# Patient Record
Sex: Female | Born: 1990 | Race: White | Hispanic: No | Marital: Married | Smoking: Current some day smoker
Health system: Southern US, Community
[De-identification: ages and names within clinical notes are randomized; demographics above are authoritative.]

## PROBLEM LIST (undated history)

## (undated) ENCOUNTER — Inpatient Hospital Stay (HOSPITAL_COMMUNITY): Payer: Self-pay

## (undated) DIAGNOSIS — F419 Anxiety disorder, unspecified: Secondary | ICD-10-CM

## (undated) DIAGNOSIS — D696 Thrombocytopenia, unspecified: Secondary | ICD-10-CM

## (undated) DIAGNOSIS — F32A Depression, unspecified: Secondary | ICD-10-CM

## (undated) DIAGNOSIS — Z862 Personal history of diseases of the blood and blood-forming organs and certain disorders involving the immune mechanism: Secondary | ICD-10-CM

## (undated) DIAGNOSIS — D693 Immune thrombocytopenic purpura: Secondary | ICD-10-CM

## (undated) DIAGNOSIS — F329 Major depressive disorder, single episode, unspecified: Secondary | ICD-10-CM

## (undated) HISTORY — PX: WISDOM TOOTH EXTRACTION: SHX21

---
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
## 2013-12-04 ENCOUNTER — Emergency Department (HOSPITAL_COMMUNITY)
Admission: EM | Admit: 2013-12-04 | Discharge: 2013-12-04 | Disposition: A | Discharge status: Home / Self Care | Payer: BC Managed Care – PPO | Attending: Emergency Medicine | Admitting: Emergency Medicine

## 2013-12-04 ENCOUNTER — Encounter (HOSPITAL_COMMUNITY): Payer: Self-pay | Admitting: Emergency Medicine

## 2013-12-04 ENCOUNTER — Emergency Department (HOSPITAL_COMMUNITY): Payer: BC Managed Care – PPO

## 2013-12-04 DIAGNOSIS — S161XXA Strain of muscle, fascia and tendon at neck level, initial encounter: Secondary | ICD-10-CM

## 2013-12-04 DIAGNOSIS — S4980XA Other specified injuries of shoulder and upper arm, unspecified arm, initial encounter: Secondary | ICD-10-CM | POA: Insufficient documentation

## 2013-12-04 DIAGNOSIS — Z3202 Encounter for pregnancy test, result negative: Secondary | ICD-10-CM | POA: Insufficient documentation

## 2013-12-04 DIAGNOSIS — S8002XA Contusion of left knee, initial encounter: Secondary | ICD-10-CM

## 2013-12-04 DIAGNOSIS — Y9389 Activity, other specified: Secondary | ICD-10-CM | POA: Insufficient documentation

## 2013-12-04 DIAGNOSIS — S8001XA Contusion of right knee, initial encounter: Secondary | ICD-10-CM

## 2013-12-04 DIAGNOSIS — Y92410 Unspecified street and highway as the place of occurrence of the external cause: Secondary | ICD-10-CM | POA: Insufficient documentation

## 2013-12-04 DIAGNOSIS — S46909A Unspecified injury of unspecified muscle, fascia and tendon at shoulder and upper arm level, unspecified arm, initial encounter: Secondary | ICD-10-CM | POA: Insufficient documentation

## 2013-12-04 DIAGNOSIS — S20219A Contusion of unspecified front wall of thorax, initial encounter: Secondary | ICD-10-CM | POA: Insufficient documentation

## 2013-12-04 DIAGNOSIS — S8000XA Contusion of unspecified knee, initial encounter: Secondary | ICD-10-CM | POA: Insufficient documentation

## 2013-12-04 DIAGNOSIS — S139XXA Sprain of joints and ligaments of unspecified parts of neck, initial encounter: Secondary | ICD-10-CM | POA: Insufficient documentation

## 2013-12-04 HISTORY — DX: Thrombocytopenia, unspecified: D69.6

## 2013-12-04 LAB — BASIC METABOLIC PANEL
BUN: 11 mg/dL (ref 6–23)
CALCIUM: 8.7 mg/dL (ref 8.4–10.5)
CHLORIDE: 106 meq/L (ref 96–112)
CO2: 25 mEq/L (ref 19–32)
CREATININE: 0.53 mg/dL (ref 0.50–1.10)
GFR calc non Af Amer: >90 mL/min (ref >90)
Glucose, Bld: 80 mg/dL (ref 70–99)
Potassium: 3.7 mEq/L (ref 3.7–5.3)
SODIUM: 143 meq/L (ref 137–147)

## 2013-12-04 LAB — CBC WITH DIFFERENTIAL/PLATELET
BASOS ABS: 0 10*3/uL (ref 0.0–0.1)
BASOS PCT: 0 % (ref 0–1)
Eosinophils Absolute: 0.3 10*3/uL (ref 0.0–0.7)
Eosinophils Relative: 3 % (ref 0–5)
HEMATOCRIT: 35.6 % — AB (ref 36.0–46.0)
Hemoglobin: 11.6 g/dL — ABNORMAL LOW (ref 12.0–15.0)
LYMPHS PCT: 42 % (ref 12–46)
Lymphs Abs: 3.9 10*3/uL (ref 0.7–4.0)
MCH: 29.4 pg (ref 26.0–34.0)
MCHC: 32.6 g/dL (ref 30.0–36.0)
MCV: 90.1 fL (ref 78.0–100.0)
MONO ABS: 0.6 10*3/uL (ref 0.1–1.0)
Monocytes Relative: 7 % (ref 3–12)
NEUTROS ABS: 4.3 10*3/uL (ref 1.7–7.7)
Neutrophils Relative %: 48 % (ref 43–77)
Platelets: 174 10*3/uL (ref 150–400)
RBC: 3.95 MIL/uL (ref 3.87–5.11)
RDW: 13 % (ref 11.5–15.5)
WBC: 9.1 10*3/uL (ref 4.0–10.5)

## 2013-12-04 LAB — POC URINE PREG, ED: PREG TEST UR: NEGATIVE

## 2013-12-04 MED ORDER — SODIUM CHLORIDE 0.9 % IV SOLN
1000.0000 mL | INTRAVENOUS | Status: DC
  Administered 2013-12-04: 1000 mL via INTRAVENOUS

## 2013-12-04 MED ORDER — MORPHINE SULFATE 4 MG/ML IJ SOLN
4.0000 mg | Freq: Once | INTRAMUSCULAR | Status: AC
  Administered 2013-12-04: 4 mg via INTRAVENOUS
  Filled 2013-12-04: qty 1

## 2013-12-04 MED ORDER — OXYCODONE-ACETAMINOPHEN 5-325 MG PO TABS: 1.0000 | ORAL_TABLET | ORAL | Status: AC | PRN

## 2013-12-04 MED ORDER — IOHEXOL 300 MG/ML  SOLN
100.0000 mL | Freq: Once | INTRAMUSCULAR | Status: AC | PRN
  Administered 2013-12-04: 100 mL via INTRAVENOUS

## 2013-12-04 MED ORDER — SODIUM CHLORIDE 0.9 % IV SOLN
1000.0000 mL | Freq: Once | INTRAVENOUS | Status: AC
  Administered 2013-12-04: 1000 mL via INTRAVENOUS
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
## 2013-12-04 NOTE — ED Notes (Signed)
CT phoned, notified pt available for transport.  Advised pregnancy test had resulted.

## 2014-09-24 ENCOUNTER — Emergency Department (HOSPITAL_COMMUNITY)
Admission: EM | Admit: 2014-09-24 | Discharge: 2014-09-24 | Disposition: A | Discharge status: Home / Self Care | Payer: Self-pay | Attending: Emergency Medicine | Admitting: Emergency Medicine

## 2014-09-24 ENCOUNTER — Encounter (HOSPITAL_COMMUNITY): Payer: Self-pay | Admitting: Emergency Medicine

## 2014-09-24 DIAGNOSIS — Z880 Allergy status to penicillin: Secondary | ICD-10-CM | POA: Insufficient documentation

## 2014-09-24 DIAGNOSIS — K029 Dental caries, unspecified: Secondary | ICD-10-CM | POA: Insufficient documentation

## 2014-09-24 DIAGNOSIS — K0889 Other specified disorders of teeth and supporting structures: Secondary | ICD-10-CM

## 2014-09-24 DIAGNOSIS — Z862 Personal history of diseases of the blood and blood-forming organs and certain disorders involving the immune mechanism: Secondary | ICD-10-CM | POA: Insufficient documentation

## 2014-09-24 DIAGNOSIS — K088 Other specified disorders of teeth and supporting structures: Secondary | ICD-10-CM | POA: Insufficient documentation

## 2014-09-24 MED ORDER — HYDROCODONE-ACETAMINOPHEN 5-325 MG PO TABS: 1.0000 | ORAL_TABLET | Freq: Four times a day (QID) | ORAL | Status: AC | PRN

## 2014-09-24 MED ORDER — CLINDAMYCIN HCL 150 MG PO CAPS
300.0000 mg | ORAL_CAPSULE | Freq: Once | ORAL | Status: AC
  Administered 2014-09-24: 300 mg via ORAL
  Filled 2014-09-24: qty 2

## 2014-09-24 MED ORDER — OXYCODONE-ACETAMINOPHEN 5-325 MG PO TABS
2.0000 | ORAL_TABLET | Freq: Once | ORAL | Status: AC
Start: 2014-09-24 — End: 2014-09-24
  Administered 2014-09-24: 2 via ORAL
  Filled 2014-09-24: qty 2

## 2014-09-24 MED ORDER — CLINDAMYCIN HCL 150 MG PO CAPS: 300.0000 mg | ORAL_CAPSULE | Freq: Three times a day (TID) | ORAL | Status: DC

## 2014-09-24 NOTE — Discharge Instructions (Signed)

## 2014-09-24 NOTE — ED Notes (Signed)
Pt here for left upper dental pain and swelling since yesterday, airway intact.  Pt unable to pick up prescription for antibiotics.

## 2014-09-24 NOTE — ED Provider Notes (Signed)
CSN: [id number]     Arrival date & time 09/24/14  2108 History   First MD Initiated Contact with Patient 09/24/14 2303     Chief Complaint  Patient presents with  . Dental Pain    (Consider location/radiation/quality/duration/timing/severity/associated sxs/prior Treatment) Patient is a 24 y.o. female presenting with tooth pain. The history is provided by the patient. No language interpreter was used.  Dental Pain Location:  Upper Upper teeth location:  13/LU 2nd bicuspid Quality:  Burning and throbbing Severity:  Moderate Onset quality:  Gradual Duration:  2 days Timing:  Constant Progression:  Worsening Chronicity:  Recurrent (Similar sx 1 year ago) Context: dental caries   Context: not trauma   Previous work-up:  Dental exam Relieved by:  Nothing Worsened by:  Touching and pressure Ineffective treatments:  NSAIDs Associated symptoms: facial swelling and gum swelling   Associated symptoms: no difficulty swallowing, no drooling, no fever, no oral bleeding, no oral lesions and no trismus     Past Medical History  Diagnosis Date  . Decreased platelet count    History reviewed. No pertinent past surgical history. No family history on file. History  Substance Use Topics  . Smoking status: Never Smoker   . Smokeless tobacco: Never Used  . Alcohol Use: No   OB History    No data available      Review of Systems  Constitutional: Negative for fever.  HENT: Positive for dental problem and facial swelling. Negative for drooling and mouth sores.   All other systems reviewed and are negative.   Allergies  Amoxicillin  Home Medications   Prior to Admission medications   Medication Sig Start Date End Date Taking? Authorizing Provider  clindamycin (CLEOCIN) 150 MG capsule Take 2 capsules (300 mg total) by mouth 3 (three) times daily. May dispense as 150mg  capsules 09/24/14   Antony MaduraKelly Randi Poullard, PA-C  HYDROcodone-acetaminophen (NORCO/VICODIN) 5-325 MG per tablet Take 1-2 tablets  by mouth every 6 (six) hours as needed. 09/24/14   Antony MaduraKelly Feliz Herard, PA-C  oxyCODONE-acetaminophen (PERCOCET) 5-325 MG per tablet Take 1 tablet by mouth every 4 (four) hours as needed for moderate pain. Patient not taking: Reported on 09/24/2014 12/04/13   Dione Boozeavid Glick, MD   BP 110/80 mmHg  Pulse 75  Temp(Src) 97.5 F (36.4 C) (Oral)  Resp 17  Ht 5\' 3"  (1.6 m)  SpO2 100%  LMP 09/17/2014   Physical Exam  Constitutional: She is oriented to person, place, and time. She appears well-developed and well-nourished. No distress.  Nontoxic/nonseptic appearing  HENT:  Head: Normocephalic and atraumatic.  Mouth/Throat: Uvula is midline, oropharynx is clear and moist and mucous membranes are normal. No trismus in the jaw. Dental caries present. No dental abscesses or uvula swelling.    Tenderness to palpation to left upper second premolar. There is mild facial swelling with no trismus. Uvula midline. Patient tolerating secretions without difficulty.  Eyes: Conjunctivae and EOM are normal. No scleral icterus.  Neck: Normal range of motion.  No nuchal rigidity or meningismus  Pulmonary/Chest: Effort normal. No respiratory distress.  Respirations even and unlabored  Musculoskeletal: Normal range of motion.  Neurological: She is alert and oriented to person, place, and time. She exhibits normal muscle tone. Coordination normal.  Skin: Skin is warm and dry. No rash noted. She is not diaphoretic. No erythema. No pallor.  Psychiatric: She has a normal mood and affect. Her behavior is normal.  Nursing note and vitals reviewed.   ED Course  Procedures (including critical care time)  Labs Review Labs Reviewed - No data to display  Imaging Review No results found.   EKG Interpretation None      MDM   Final diagnoses:  Dentalgia    Patient with toothache. No gross abscess. Exam unconcerning for Ludwig's angina or spread of infection. Will treat with clindamycin and pain medicine.Urged patient to  follow-up with dentist. Appt scheduled for 10/05/14. Return precautions given. Patient agreeable to plan with no unaddressed concerns.   Filed Vitals:   09/24/14 2148 09/24/14 2319  BP: 112/68 110/80  Pulse: 70 75  Temp: 98.2 F (36.8 C) 97.5 F (36.4 C)  TempSrc: Oral Oral  Resp: 18 17  Height:  (1.6 m)   SpO2: 100% 100%       Antony Madura, PA-C 09/24/14 2327  Dione Booze, MD 09/25/14 [phone]

## 2014-11-08 ENCOUNTER — Encounter (HOSPITAL_COMMUNITY): Payer: Self-pay | Admitting: *Deleted

## 2014-11-08 ENCOUNTER — Emergency Department (HOSPITAL_COMMUNITY)
Admission: EM | Admit: 2014-11-08 | Discharge: 2014-11-08 | Disposition: A | Discharge status: Home / Self Care | Payer: Self-pay | Attending: Emergency Medicine | Admitting: Emergency Medicine

## 2014-11-08 ENCOUNTER — Emergency Department (HOSPITAL_COMMUNITY): Payer: Self-pay

## 2014-11-08 DIAGNOSIS — Y9301 Activity, walking, marching and hiking: Secondary | ICD-10-CM | POA: Insufficient documentation

## 2014-11-08 DIAGNOSIS — Z880 Allergy status to penicillin: Secondary | ICD-10-CM | POA: Insufficient documentation

## 2014-11-08 DIAGNOSIS — Y9289 Other specified places as the place of occurrence of the external cause: Secondary | ICD-10-CM | POA: Insufficient documentation

## 2014-11-08 DIAGNOSIS — T148XXA Other injury of unspecified body region, initial encounter: Secondary | ICD-10-CM

## 2014-11-08 DIAGNOSIS — S70311A Abrasion, right thigh, initial encounter: Secondary | ICD-10-CM | POA: Insufficient documentation

## 2014-11-08 DIAGNOSIS — W540XXA Bitten by dog, initial encounter: Secondary | ICD-10-CM | POA: Insufficient documentation

## 2014-11-08 DIAGNOSIS — Z862 Personal history of diseases of the blood and blood-forming organs and certain disorders involving the immune mechanism: Secondary | ICD-10-CM | POA: Insufficient documentation

## 2014-11-08 DIAGNOSIS — S81831A Puncture wound without foreign body, right lower leg, initial encounter: Secondary | ICD-10-CM | POA: Insufficient documentation

## 2014-11-08 DIAGNOSIS — Y998 Other external cause status: Secondary | ICD-10-CM | POA: Insufficient documentation

## 2014-11-08 DIAGNOSIS — Z23 Encounter for immunization: Secondary | ICD-10-CM | POA: Insufficient documentation

## 2014-11-08 DIAGNOSIS — Z87891 Personal history of nicotine dependence: Secondary | ICD-10-CM | POA: Insufficient documentation

## 2014-11-08 HISTORY — DX: Immune thrombocytopenic purpura: D69.3

## 2014-11-08 MED ORDER — CLINDAMYCIN HCL 150 MG PO CAPS: 450.0000 mg | ORAL_CAPSULE | Freq: Three times a day (TID) | ORAL | Status: AC

## 2014-11-08 MED ORDER — SULFAMETHOXAZOLE-TRIMETHOPRIM 800-160 MG PO TABS: 1.0000 | ORAL_TABLET | Freq: Two times a day (BID) | ORAL | Status: AC

## 2014-11-08 MED ORDER — TETANUS-DIPHTH-ACELL PERTUSSIS 5-2.5-18.5 LF-MCG/0.5 IM SUSP
0.5000 mL | Freq: Once | INTRAMUSCULAR | Status: AC
  Administered 2014-11-08: 0.5 mL via INTRAMUSCULAR
  Filled 2014-11-08: qty 0.5

## 2014-11-08 MED ORDER — HYDROCODONE-ACETAMINOPHEN 5-325 MG PO TABS: 1.0000 | ORAL_TABLET | Freq: Four times a day (QID) | ORAL | Status: AC | PRN

## 2014-11-08 MED ORDER — HYDROCODONE-ACETAMINOPHEN 5-325 MG PO TABS
2.0000 | ORAL_TABLET | Freq: Once | ORAL | Status: AC
Start: 2014-11-08 — End: 2014-11-08
  Administered 2014-11-08: 2 via ORAL
  Filled 2014-11-08: qty 2

## 2014-11-08 NOTE — ED Provider Notes (Signed)
CSN: [id number]     Arrival date & time 11/08/14  1416 History   First MD Initiated Contact with Patient 11/08/14 1436     Chief Complaint  Patient presents with  . Animal Bite   The history is provided by the patient. No language interpreter was used.   This chart was scribed for non-physician practitioner Ladona MowJoe Nijah Orlich, PA-C, working with No att. providers found, by Andrew Auaven Small, ED Scribe. This patient was seen in room TR10C/TR10C and the patient's care was started at 6:41 PM.  Gwendolyn Lawrence is a 24 y.o. female who presents to the Emergency Department complaining of an animal bite. Pt states while walking outside she was chased by her neighbor's 24 year old dog, who got loose from his chain, and was bit 3 times to left leg. Pt unsure if dog is UTD on immunizations and states her neighbor took the dog to the shelter immediately after she was bitten to have it quarantined. She took ibuprofen but is still in pain. Pt is unsure of last TDAP.     Past Medical History  Diagnosis Date  . Decreased platelet count   . ITP (idiopathic thrombocytopenic purpura)    Past Surgical History  Procedure Laterality Date  . Wisdom tooth extraction     History reviewed. No pertinent family history. History  Substance Use Topics  . Smoking status: Former Games developermoker  . Smokeless tobacco: Never Used  . Alcohol Use: No   OB History    No data available     Review of Systems  Skin: Positive for wound.  Neurological: Negative for weakness and numbness.      Allergies  Amoxicillin  Home Medications   Prior to Admission medications   Medication Sig Start Date End Date Taking? Authorizing Provider  clindamycin (CLEOCIN) 150 MG capsule Take 3 capsules (450 mg total) by mouth 3 (three) times daily. 11/08/14   Ladona MowJoe Nansi [place], PA-C  HYDROcodone-acetaminophen (NORCO/VICODIN) 5-325 MG per tablet Take 1-2 tablets by mouth every 6 (six) hours as needed. 09/24/14   Antony MaduraKelly Humes, PA-C  HYDROcodone-acetaminophen  (NORCO/VICODIN) 5-325 MG per tablet Take 1-2 tablets by mouth every 6 (six) hours as needed. 11/08/14   Ladona MowJoe Timmey Lamba, PA-C  oxyCODONE-acetaminophen (PERCOCET) 5-325 MG per tablet Take 1 tablet by mouth every 4 (four) hours as needed for moderate pain. Patient not taking: Reported on 09/24/2014 12/04/13   Dione Boozeavid Glick, MD  sulfamethoxazole-trimethoprim (BACTRIM DS,SEPTRA DS) 800-160 MG per tablet Take 1 tablet by mouth 2 (two) times daily. 11/08/14   Ladona MowJoe Abagale Boulos, PA-C   BP 100/70 mmHg  Pulse 50  Temp(Src) 98.6 F (37 C) (Oral)  Resp 16  Ht 5\' 2"  (1.575 m)  Wt 115 lb (52.164 kg)  BMI 21.03 kg/m2  SpO2 100%  LMP 11/07/2014 Physical Exam  Constitutional: She is oriented to person, place, and time. She appears well-developed and well-nourished. No distress.  HENT:  Head: Normocephalic and atraumatic.  Eyes: Conjunctivae and EOM are normal.  Neck: Neck supple.  Cardiovascular: Normal rate.   Pulmonary/Chest: Effort normal.  Musculoskeletal: Normal range of motion.  Neurological: She is alert and oriented to person, place, and time.  Skin: Skin is warm and dry.  2, 3-4 cm abrasions noted to upper thigh on the lateral aspect of right leg. Puncture wound noted to anterior portion of tibial region on right leg, approximately 1 cm in diameter.  Psychiatric: She has a normal mood and affect. Her behavior is normal.  Nursing note and vitals reviewed.  ED Course  Procedures (including critical care time) DIAGNOSTIC STUDIES: Oxygen Saturation is 100% on RA, normal by my interpretation.    COORDINATION OF CARE: 6:41 PM- Pt advised of plan for treatment and pt agrees.  Labs Review Labs Reviewed - No data to display  Imaging Review Dg Tibia/fibula Right  11/08/2014   CLINICAL DATA:  Dog bite to the right leg  EXAM: RIGHT TIBIA AND FIBULA - 2 VIEW  COMPARISON:  None.  FINDINGS: There is no evidence of fracture or other focal bone lesions. Soft tissues are unremarkable.  IMPRESSION: Negative.    Electronically Signed   By: Christiana Pellant M.D.   On: 11/08/2014 16:53   Dg Ankle Complete Right  11/08/2014   CLINICAL DATA:  Dog bite to the right leg  EXAM: RIGHT ANKLE - COMPLETE 3+ VIEW  COMPARISON:  None.  FINDINGS: There is no evidence of fracture, dislocation, or joint effusion. There is no evidence of arthropathy or other focal bone abnormality. Soft tissues are unremarkable.  IMPRESSION: Negative.   Electronically Signed   By: Christiana Pellant M.D.   On: 11/08/2014 16:53   Dg Femur, Min 2 Views Right  11/08/2014   CLINICAL DATA:  Dog bite to the right leg  EXAM: RIGHT FEMUR 2 VIEWS  COMPARISON:  None.  FINDINGS: There is no evidence of fracture or other focal bone lesions. Soft tissues are unremarkable. No radiopaque foreign body.  IMPRESSION: Negative.   Electronically Signed   By: Christiana Pellant M.D.   On: 11/08/2014 16:52     EKG Interpretation None      MDM   Final diagnoses:  Animal bite    Patient here dog bite. Tetanus updated. Radiographs unremarkable for foreign bodies. Patient has 2 small abrasions, and one puncture wound. Neither the wounds are amenable to any repair given the size and consistency with puncture wounds and abrasions. Radiographs unremarkable for acute pathology or foreign bodies. Wounds were irrigated extensively with saline. Given that the dog is a known animal/pet to her neighbors, and that the dog is currently being quarantined, do not need to initiate rabies vaccination/4 reflexes at this time. Educated patient on proper return should she need prophylaxis/vaccination. Patient verbalizes understanding and agreement with this. Place patient on clindamycin and Bactrim due to amoxicillin allergy, this was per pharmacy consult. Discussed return precautions with patient regarding her wounds, patient verbalizes understanding and agreement of this plan. Encouraged patient to call or return to the ER if any worsening symptoms or should she have a questions or  concerns.  I personally performed the services described in this documentation, which was scribed in my presence. The recorded information has been reviewed and is accurate.  BP 100/70 mmHg  Pulse 50  Temp(Src) 98.6 F (37 C) (Oral)  Resp 16  Ht  (1.575 m)  Wt 115 lb (52.164 kg)  BMI 21.03 kg/m2  SpO2 100%  LMP 11/07/2014  Signed,  Ladona Mow, PA-C 6:41 PM    Ladona Mow, PA-C 11/08/14 1841  Arby Barrette, MD 11/14/14 [phone]

## 2014-11-08 NOTE — ED Notes (Signed)
Declined W/C at D/C and was escorted to lobby by RN. 

## 2014-11-08 NOTE — Discharge Instructions (Signed)
Return to the emergency room or [facility] if he find out the dog you're bitten by any symptoms of rabies. Also return if you find the dog has not been immunized against rabies. Return to ER if your wounds become infected with redness, warmth, swelling, purulent discharge with green, yellow, white, thick fluid. Return with any high fever greater 100.93F.  Animal Bite An animal bite can result in a scratch on the skin, deep open cut, puncture of the skin, crush injury, or tearing away of the skin or a body part. Dogs are responsible for most animal bites. Children are bitten more often than adults. An animal bite can range from very mild to more serious. A small bite from your house pet is no cause for alarm. However, some animal bites can become infected or injure a bone or other tissue. You must seek medical care if:  The skin is broken and bleeding does not slow down or stop after 15 minutes.  The puncture is deep and difficult to clean (such as a cat bite).  Pain, warmth, redness, or pus develops around the wound.  The bite is from a stray animal or rodent. There may be a risk of rabies infection.  The bite is from a snake, raccoon, skunk, fox, coyote, or bat. There may be a risk of rabies infection.  The person bitten has a chronic illness such as diabetes, liver disease, or cancer, or the person takes medicine that lowers the immune system.  There is concern about the location and severity of the bite. It is important to clean and protect an animal bite wound right away to prevent infection. Follow these steps:  Clean the wound with plenty of water and soap.  Apply an antibiotic cream.  Apply gentle pressure over the wound with a clean towel or gauze to slow or stop bleeding.  Elevate the affected area above the heart to help stop any bleeding.  Seek medical care. Getting medical care within 8 hours of the animal bite leads to the best possible outcome. DIAGNOSIS  Your  caregiver will most likely:  Take a detailed history of the animal and the bite injury.  Perform a wound exam.  Take your medical history. Blood tests or X-rays may be performed. Sometimes, infected bite wounds are cultured and sent to a lab to identify the infectious bacteria.  TREATMENT  Medical treatment will depend on the location and type of animal bite as well as the patient's medical history. Treatment may include:  Wound care, such as cleaning and flushing the wound with saline solution, bandaging, and elevating the affected area.  Antibiotics.  Tetanus immunization.  Rabies immunization.  Leaving the wound open to heal. This is often done with animal bites, due to the high risk of infection. However, in certain cases, wound closure with stitches, wound adhesive, skin adhesive strips, or staples may be used. Infected bites that are left untreated may require intravenous (IV) antibiotics and surgical treatment in the hospital. HOME CARE INSTRUCTIONS  Follow your caregiver's instructions for wound care.  Take all medicines as directed.  If your caregiver prescribes antibiotics, take them as directed. Finish them even if you start to feel better.  Follow up with your caregiver for further exams or immunizations as directed. You may need a tetanus shot if:  You cannot remember when you had your last tetanus shot.  You have never had a tetanus shot.  The injury broke your skin. If you get a  tetanus shot, your arm may swell, get red, and feel warm to the touch. This is common and not a problem. If you need a tetanus shot and you choose not to have one, there is a rare chance of getting tetanus. Sickness from tetanus can be serious. SEEK MEDICAL CARE IF:  You notice warmth, redness, soreness, swelling, pus discharge, or a bad smell coming from the wound.  You have a red line on the skin coming from the wound.  You have a fever, chills, or a general ill feeling.  You  have nausea or vomiting.  You have continued or worsening pain.  You have trouble moving the injured part.  You have other questions or concerns. MAKE SURE YOU:  Understand these instructions.  Will watch your condition.  Will get help right away if you are not doing well or get worse. Document Released: 03/21/2011 Document Revised: 09/25/2011 Document Reviewed: 03/21/2011 [facility] Patient Information 2015 [place], [place]. This information is not intended to replace advice given to you by your health care provider. Make sure you discuss any questions you have with your health care provider.   Emergency Department Resource Guide 1) Find a Doctor and Pay Out of Pocket Although you won't have to find out who is covered by your insurance plan, it is a good idea to ask around and get recommendations. You will then need to call the office and see if the doctor you have chosen will accept you as a new patient and what types of options they offer for patients who are self-pay. Some doctors offer discounts or will set up payment plans for their patients who do not have insurance, but you will need to ask so you aren't surprised when you get to your appointment.  2) Contact Your Local Health Department Not all health departments have doctors that can see patients for sick visits, but many do, so it is worth a call to see if yours does. If you don't know where your local health department is, you can check in your phone book. The CDC also has a tool to help you locate your state's health department, and many state websites also have listings of all of their local health departments.  3) Find a Walk-in Clinic If your illness is not likely to be very severe or complicated, you may want to try a walk in clinic. These are popping up all over the country in pharmacies, drugstores, and shopping centers. They're usually staffed by nurse practitioners or physician assistants that have been trained to treat  common illnesses and complaints. They're usually fairly quick and inexpensive. However, if you have serious medical issues or chronic medical problems, these are probably not your best option.  No Primary Care Doctor: - Call Health Connect at  [phone] - they can help you locate a primary care doctor that  accepts your insurance, provides certain services, etc. - Physician Referral Service- [phone]  Chronic Pain Problems: Organization         Address  Phone   Notes  [facility]  [phone] Patients need to be referred by their primary care doctor.   Medication Assistance: Organization         Address  Phone   Notes  [facility] Medication [facility] [address] [place] [place], [place] [address] [phone] --Must be a resident of [facility] -- Must have NO insurance coverage whatsoever (no Medicaid/ Medicare, etc.) -- The pt. MUST have a  primary care doctor that directs their care regularly and follows them in the community   [facility]ist  [phone]   [employer]  [phone]    Agencies that provide inexpensive medical care: Organization         Address  Phone   Notes  [facility]  [phone]   [facility]    [phone]   [facility] [address] [place], [place] [address] [phone]   [facility] [address] [place], [place] [phone]   [facility]anned Parenthood    [phone]   [facility]    [phone]   [facility]  [address], [place] Phone:  [phone], Fax:  [phone] Hours of Operation:  9 am - 6 pm, M-F.  Also accepts Medicaid/Medicare and self-pay.  [facility]  [address], [place] Phone: [phone], Fax: [phone]. Hours of Operation:  8:30 am - 5:30 pm, M-F.  Also accepts Medicaid and self-pay.  [facility] [place] [address] [place] Phone: [phone]   [facility] [address] Natasha Bence [place], [place] [phone], Ext. [phone] Mondays & Thursdays: 7-9 AM.  First 15 patients are seen on a first come, first serve basis.    Medicaid-accepting [facility] Providers:  Organization         Address  Phone   Notes  [facility] [address], [place] [phone] Also accepts self-pay patients.  [facility] [address] Laurell Josephs [place], [place]  [phone]   [facility] [address], [place] [phone]   [facility] [address] [place] [phone]   Renaye Rakers [address], [place]   [phone] Only accepts [place] Access [place] patients after they have their name applied to their card.   Self-Pay (no insurance) in [facility]:  Organization         Address  Phone   Notes  Sickle Cell Patients, [facility] [address] [place], [place] [phone]   [facility] [address] [place], [place] [phone]   [facility] [place]  [address], [place] [phone]   [facility]/Dr. Osei-Bonsu  [address], [place] or [address], [place] [phone] Phone number for both [place] and [place] locations is the same.  [facility] [address], [place] [phone]   [facility] [address] [place] or [address] [phone]   [facility] [address], [place] [phone], phone; [phone], fax Sees patients 1st and 3rd Saturday of every month.  Must not qualify for public or private insurance (i.e. Medicaid, Medicare,  Health Choice, Veterans' Benefits)  Household income should be no more than 200% of the  poverty level The clinic cannot treat you if you are pregnant or think you are pregnant  Sexually transmitted diseases are not treated at the clinic.    Dental Care: Organization         Address  Phone  Notes  [facility] [address] [place], [place] [phone])  [phone] Accepts children up to age 24 who are enrolled in Medicaid or [place] Health Choice; pregnant women with a Medicaid card; and children who have applied for Medicaid or [place] Health Choice, but were declined, whose parents can pay a reduced fee at time of service.  [facility] [place]  [address], [place] [phone] Accepts children up to age 24 who are enrolled in [place] or [place] Health Choice; pregnant women with a Medicaid card; and children who have applied for Medicaid or [place] Health Choice, but were declined, whose parents can pay a reduced fee at time of service.  [facility]  [address], [place] [phone] Patients are seen by appointment only. Walk-ins are not accepted. [facility] will see patients 24 years of age and older. Monday - Tuesday (8am-5pm) Most Wednesdays (8:30-5pm) $30 per visit, cash only  [facility]  [address], [facility] [place] [phone] Patients are seen by appointment only. Walk-ins are not accepted. [facility] will see patients 24 years of age and older. One Wednesday Evening (Monthly: Volunteer Based).  $30 per visit, cash only  [employer] [facility]  [phone] for adults; Children under age 434, call Graduate Pediatric Dentistry at [phone]. Children aged 904-14, please call [phone] to request a pediatric application.  Dental services are provided in all areas of dental care including fillings, crowns and bridges, complete and partial dentures, implants, gum treatment, root canals, and extractions.  Preventive care is also provided. Treatment is provided to both adults and children. Patients are selected via a lottery and there is often a waiting list.   [facility] [address], [place]  [phone] [email or web address]   [facility] [address], [place], [place] [phone], Ext. [phone] Second and Fourth Thursday of each month, opens at 6:30 AM; Clinic ends at 9 AM.  Patients are seen on a first-come first-served basis, and a limited number are seen during each clinic.   [facility]  [address], [place], [place] [phone]   Eligibility Requirements You must have lived in [place], [place], or [place] counties for at least the last three months.   You cannot be eligible for state or federal sponsored [place] Cityhealthcare insurance, including CIGNAVeterans Administration, [place]icaid, or [employer]icare.   You generally cannot be eligible for healthcare insurance through your employer.    How to apply: Eligibility screenings are held every Tuesday and Wednesday afternoon from 1:00 pm until 4:00 pm. You do not need an appointment for the interview!  [facility] [address], [place], [place] [phone]   [facility]  [phone]   [facility]  [phone]   [facility]  [phone]    Behavioral Health Resources in the Community: Intensive Outpatient Programs Organization         Address  Phone  Notes  [facility] [address], [place], [place] [phone]   [facility] [address], [place], [place] [phone]   [facility] [address], [place], [place]  [phone]   [facility] [address],  [place], [place] [phone] or [phone]   Substance Abuse Resources Organization         Address  Phone  Notes  [facility]  [phone]  [place]  [phone]   [facility] [place]   Chinita Pester  [phone]   Residential & Outpatient Substance Abuse Program  [phone]   Psychological Services Organization         Address  Phone  Notes  [facility] [place]  [place]  [phone]   [place] [address] [place] or [phone]    Mobile Crisis Teams Organization         Address  Phone  Notes  [facility], Mobile Crisis Care Unit  [phone]   [facility]  [address] [place], [place]   [place] Levels [address] [place] [phone]    Self-Help/Support Groups Organization         Address  Phone             Notes  [place]. of [place] - variety of support groups  Clare Call for more information  Narcotics Anonymous (NA), [facility] [address], [employer] [place]  2 meetings at this location   Special educational needs teacher         Address  Phone  Notes  [facility]idential [facility] [place],    [place]  [phone]   [facility]  [address], [place] [address], [place], [place]   [place], [place] [phone] Admissions: 8am-3pm M-F  Incentives Substance [place] [address]    [place], [place] [phone]   [facility] [address] [place], [place], [place]   [facility] [address],  [place], [place]   [facility] - Intensive Outpatient [address] Kristeen Mans [address], [place], [place]   [facility] ([address] [place].,  [place], [place] [phone] or [phone]   [facility] (RTS) [address] [place], [place] Accepts Medicaid  [facility] [address]  [place]  [phone] Substance Abuse/Addiction Treatment   [facility] Organization         Address  Phone  Notes  [facility]  [phone]   Domenic Schwab, PhD [address] Arlis Porta [place], [place]   [phone] or [phone]   [place], [place] [phone]   [facility] [address] [place] [address] [place], [place] [phone] Insurance/Medicaid/sponsorship through [facility] [address] [place]                                    [place], [place] [phone] [place] [address], [place] [phone]    Dr. Adele Schilder  [phone]   [facility] 1) [address]  2) [place] 3)  [address], [place] [phone]  [phone]   [place] [phone] or [phone] (After Hours)

## 2017-07-27 ENCOUNTER — Emergency Department (HOSPITAL_COMMUNITY): Admission: EM | Admit: 2017-07-27 | Discharge: 2017-07-27 | Discharge status: Absent without leave | Payer: Self-pay

## 2017-07-27 ENCOUNTER — Inpatient Hospital Stay (HOSPITAL_COMMUNITY)
Admission: AD | Admit: 2017-07-27 | Discharge: 2017-07-27 | Disposition: A | Discharge status: Home / Self Care | Payer: Self-pay | Source: Ambulatory Visit | Attending: Obstetrics and Gynecology | Admitting: Obstetrics and Gynecology

## 2017-07-27 ENCOUNTER — Encounter (HOSPITAL_COMMUNITY): Payer: Self-pay | Admitting: *Deleted

## 2017-07-27 DIAGNOSIS — O9989 Other specified diseases and conditions complicating pregnancy, childbirth and the puerperium: Secondary | ICD-10-CM

## 2017-07-27 DIAGNOSIS — K529 Noninfective gastroenteritis and colitis, unspecified: Secondary | ICD-10-CM

## 2017-07-27 DIAGNOSIS — Z87891 Personal history of nicotine dependence: Secondary | ICD-10-CM | POA: Insufficient documentation

## 2017-07-27 DIAGNOSIS — Z3A13 13 weeks gestation of pregnancy: Secondary | ICD-10-CM | POA: Insufficient documentation

## 2017-07-27 DIAGNOSIS — Z880 Allergy status to penicillin: Secondary | ICD-10-CM | POA: Insufficient documentation

## 2017-07-27 DIAGNOSIS — O26891 Other specified pregnancy related conditions, first trimester: Secondary | ICD-10-CM | POA: Insufficient documentation

## 2017-07-27 DIAGNOSIS — R197 Diarrhea, unspecified: Secondary | ICD-10-CM | POA: Insufficient documentation

## 2017-07-27 DIAGNOSIS — O219 Vomiting of pregnancy, unspecified: Secondary | ICD-10-CM | POA: Insufficient documentation

## 2017-07-27 HISTORY — DX: Personal history of diseases of the blood and blood-forming organs and certain disorders involving the immune mechanism: Z86.2

## 2017-07-27 LAB — CBC WITH DIFFERENTIAL/PLATELET
BASOS ABS: 0 10*3/uL (ref 0.0–0.1)
BASOS PCT: 0 %
Eosinophils Absolute: 0 10*3/uL (ref 0.0–0.7)
Eosinophils Relative: 0 %
HCT: 38.1 % (ref 36.0–46.0)
Hemoglobin: 13.5 g/dL (ref 12.0–15.0)
LYMPHS PCT: 4 %
Lymphs Abs: 0.8 10*3/uL (ref 0.7–4.0)
MCH: 30.7 pg (ref 26.0–34.0)
MCHC: 35.4 g/dL (ref 30.0–36.0)
MCV: 86.6 fL (ref 78.0–100.0)
MONO ABS: 0.5 10*3/uL (ref 0.1–1.0)
Monocytes Relative: 2 %
Neutro Abs: 19.6 10*3/uL — ABNORMAL HIGH (ref 1.7–7.7)
Neutrophils Relative %: 94 %
PLATELETS: 183 10*3/uL (ref 150–400)
RBC: 4.4 MIL/uL (ref 3.87–5.11)
RDW: 12.8 % (ref 11.5–15.5)
WBC: 20.9 10*3/uL — AB (ref 4.0–10.5)

## 2017-07-27 LAB — URINALYSIS, ROUTINE W REFLEX MICROSCOPIC
Bilirubin Urine: NEGATIVE
GLUCOSE, UA: NEGATIVE mg/dL
HGB URINE DIPSTICK: NEGATIVE
Ketones, ur: 80 mg/dL — AB
Leukocytes, UA: NEGATIVE
NITRITE: NEGATIVE
PROTEIN: 100 mg/dL — AB
Specific Gravity, Urine: 1.031 — ABNORMAL HIGH (ref 1.005–1.030)
pH: 5 (ref 5.0–8.0)

## 2017-07-27 LAB — COMPREHENSIVE METABOLIC PANEL
ALT: 20 U/L (ref 14–54)
AST: 22 U/L (ref 15–41)
Albumin: 4.5 g/dL (ref 3.5–5.0)
Alkaline Phosphatase: 54 U/L (ref 38–126)
Anion gap: 13 (ref 5–15)
BUN: 18 mg/dL (ref 6–20)
CO2: 18 mmol/L — AB (ref 22–32)
Calcium: 9 mg/dL (ref 8.9–10.3)
Chloride: 104 mmol/L (ref 101–111)
Creatinine, Ser: 0.48 mg/dL (ref 0.44–1.00)
Glucose, Bld: 160 mg/dL — ABNORMAL HIGH (ref 65–99)
POTASSIUM: 3.7 mmol/L (ref 3.5–5.1)
Sodium: 135 mmol/L (ref 135–145)
Total Bilirubin: 0.8 mg/dL (ref 0.3–1.2)
Total Protein: 7.5 g/dL (ref 6.5–8.1)

## 2017-07-27 MED ORDER — PROMETHAZINE HCL 25 MG RE SUPP: 25.0000 mg | Freq: Four times a day (QID) | RECTAL | 0 refills | Status: DC | PRN

## 2017-07-27 MED ORDER — PROMETHAZINE HCL 25 MG/ML IJ SOLN
25.0000 mg | Freq: Once | INTRAMUSCULAR | Status: AC
  Administered 2017-07-27: 25 mg via INTRAVENOUS
  Filled 2017-07-27: qty 1

## 2017-07-27 MED ORDER — PROMETHAZINE HCL 25 MG PO TABS: 25.0000 mg | ORAL_TABLET | Freq: Four times a day (QID) | ORAL | 0 refills | Status: DC | PRN

## 2017-07-27 MED ORDER — DEXTROSE IN LACTATED RINGERS 5 % IV SOLN
INTRAVENOUS | Status: DC
  Administered 2017-07-27: 09:00:00 via INTRAVENOUS

## 2017-07-27 NOTE — Discharge Instructions (Signed)
Viral Gastroenteritis, Adult  Viral gastroenteritis is also known as the stomach flu. This condition is caused by certain germs (viruses). These germs can be passed from person to person very easily (are very contagious). This condition can cause sudden watery poop (diarrhea), fever, and throwing up (vomiting).  Having watery poop and throwing up can make you feel weak and cause you to get dehydrated. Dehydration can make you tired and thirsty, make you have a dry mouth, and make it so you pee (urinate) less often. Older adults and people with other diseases or a weak defense system (immune system) are at higher risk for dehydration. It is important to replace the fluids that you lose from having watery poop and throwing up.  Follow these instructions at home:  Follow instructions from your doctor about how to care for yourself at home.  Eating and drinking    Follow these instructions as told by your doctor:   Take an oral rehydration solution (ORS). This is a drink that is sold at pharmacies and stores.   Drink clear fluids in small amounts as you are able, such as:  ? Water.  ? Ice chips.  ? Diluted fruit juice.  ? Low-calorie sports drinks.   Eat bland, easy-to-digest foods in small amounts as you are able, such as:  ? Bananas.  ? Applesauce.  ? Rice.  ? Low-fat (lean) meats.  ? Toast.  ? Crackers.   Avoid fluids that have a lot of sugar or caffeine in them.   Avoid alcohol.   Avoid spicy or fatty foods.    General instructions   Drink enough fluid to keep your pee (urine) clear or pale yellow.   Wash your hands often. If you cannot use soap and water, use hand sanitizer.   Make sure that all people in your home wash their hands well and often.   Rest at home while you get better.   Take over-the-counter and prescription medicines only as told by your doctor.   Watch your condition for any changes.   Take a warm bath to help with any burning or pain from having watery poop.   Keep all follow-up  visits as told by your doctor. This is important.  Contact a doctor if:   You cannot keep fluids down.   Your symptoms get worse.   You have new symptoms.   You feel light-headed or dizzy.   You have muscle cramps.  Get help right away if:   You have chest pain.   You feel very weak or you pass out (faint).   You see blood in your throw-up.   Your throw-up looks like coffee grounds.   You have bloody or black poop (stools) or poop that look like tar.   You have a very bad headache, a stiff neck, or both.   You have a rash.   You have very bad pain, cramping, or bloating in your belly (abdomen).   You have trouble breathing.   You are breathing very quickly.   Your heart is beating very quickly.   Your skin feels cold and clammy.   You feel confused.   You have pain when you pee.   You have signs of dehydration, such as:  ? Dark pee, hardly any pee, or no pee.  ? Cracked lips.  ? Dry mouth.  ? Sunken eyes.  ? Sleepiness.  ? Weakness.  This information is not intended to replace advice given to you by your   health care provider. Make sure you discuss any questions you have with your health care provider.  Document Released: 12/20/2007 Document Revised: 01/21/2016 Document Reviewed: 03/09/2015  Elsevier Interactive Patient Education  2017 [employer]        Food Choices to Help Relieve Diarrhea, Adult  When you have diarrhea, the foods you eat and your eating habits are very important. Choosing the right foods and drinks can help:   Relieve diarrhea.   Replace lost fluids and nutrients.   Prevent dehydration.    What general guidelines should I follow?  Relieving diarrhea   Choose foods with less than 2 g or .07 oz. of fiber per serving.   Limit fats to less than 8 tsp (38 g or 1.34 oz.) a day.   Avoid the following:  ? Foods and beverages sweetened with high-fructose corn syrup, honey, or sugar alcohols such as xylitol, sorbitol, and mannitol.  ? Foods that contain a lot of fat or  sugar.  ? Fried, greasy, or spicy foods.  ? High-fiber grains, breads, and cereals.  ? Raw fruits and vegetables.   Eat foods that are rich in probiotics. These foods include dairy products such as yogurt and fermented milk products. They help increase healthy bacteria in the stomach and intestines (gastrointestinal tract, or GI tract).   If you have lactose intolerance, avoid dairy products. These may make your diarrhea worse.   Take medicine to help stop diarrhea (antidiarrheal medicine) only as told by your health care provider.  Replacing nutrients   Eat small meals or snacks every 3-4 hours.   Eat bland foods, such as white rice, toast, or baked potato, until your diarrhea starts to get better. Gradually reintroduce nutrient-rich foods as tolerated or as told by your health care provider. This includes:  ? Well-cooked protein foods.  ? Peeled, seeded, and soft-cooked fruits and vegetables.  ? Low-fat dairy products.   Take vitamin and mineral supplements as told by your health care provider.  Preventing dehydration     Start by sipping water or a special solution to prevent dehydration (oral rehydration solution, ORS). Urine that is clear or pale yellow means that you are getting enough fluid.   Try to drink at least 8-10 cups of fluid each day to help replace lost fluids.   You may add other liquids in addition to water, such as clear juice or decaffeinated sports drinks, as tolerated or as told by your health care provider.   Avoid drinks with caffeine, such as coffee, tea, or soft drinks.   Avoid alcohol.  What foods are recommended?  The items listed may not be a complete list. Talk with your health care provider about what dietary choices are best for you.  Grains  White rice. White, French, or pita breads (fresh or toasted), including plain rolls, buns, or bagels. White pasta. Saltine, soda, or graham crackers. Pretzels. Low-fiber cereal. Cooked cereals made with water (such as cornmeal,  farina, or cream cereals). Plain muffins. Matzo. Melba toast. Zwieback.  Vegetables  Potatoes (without the skin). Most well-cooked and canned vegetables without skins or seeds. Tender lettuce.  Fruits  Apple sauce. Fruits canned in juice. Cooked apricots, cherries, grapefruit, peaches, pears, or plums. Fresh bananas and cantaloupe.  Meats and other protein foods  Baked or boiled chicken. Eggs. Tofu. Fish. Seafood. Smooth nut butters. Ground or well-cooked tender beef, ham, veal, lamb, pork, or poultry.  Dairy  Plain yogurt, kefir, and unsweetened liquid yogurt. Lactose-free milk, buttermilk, skim   milk, or soy milk. Low-fat or nonfat hard cheese.  Beverages  Water. Low-calorie sports drinks. Fruit juices without pulp. Strained tomato and vegetable juices. Decaffeinated teas. Sugar-free beverages not sweetened with sugar alcohols. Oral rehydration solutions, if approved by your health care provider.  Seasoning and other foods  Bouillon, broth, or soups made from recommended foods.  What foods are not recommended?  The items listed may not be a complete list. Talk with your health care provider about what dietary choices are best for you.  Grains  Whole grain, whole wheat, bran, or rye breads, rolls, pastas, and crackers. Wild or brown rice. Whole grain or bran cereals. Barley. Oats and oatmeal. Corn tortillas or taco shells. Granola. Popcorn.  Vegetables  Raw vegetables. Fried vegetables. Cabbage, broccoli, Brussels sprouts, artichokes, baked beans, beet greens, corn, kale, legumes, peas, sweet potatoes, and yams. Potato skins. Cooked spinach and cabbage.  Fruits  Dried fruit, including raisins and dates. Raw fruits. Stewed or dried prunes. Canned fruits with syrup.  Meat and other protein foods  Fried or fatty meats. Deli meats. Chunky nut butters. Nuts and seeds. Beans and lentils. Bacon. Hot dogs. Sausage.  Dairy  High-fat cheeses. Whole milk, chocolate milk, and beverages made with milk, such as milk shakes.  Half-and-half. Cream. sour cream. Ice cream.  Beverages  Caffeinated beverages (such as coffee, tea, soda, or energy drinks). Alcoholic beverages. Fruit juices with pulp. Prune juice. Soft drinks sweetened with high-fructose corn syrup or sugar alcohols. High-calorie sports drinks.  Fats and oils  Butter. Cream sauces. Margarine. Salad oils. Plain salad dressings. Olives. Avocados. Mayonnaise.  Sweets and desserts  Sweet rolls, doughnuts, and sweet breads. Sugar-free desserts sweetened with sugar alcohols such as xylitol and sorbitol.  Seasoning and other foods  Honey. Hot sauce. Chili powder. Gravy. Cream-based or milk-based soups. Pancakes and waffles.  Summary   When you have diarrhea, the foods you eat and your eating habits are very important.   Make sure you get at least 8-10 cups of fluid each day, or enough to keep your urine clear or pale yellow.   Eat bland foods and gradually reintroduce healthy, nutrient-rich foods as tolerated, or as told by your health care provider.   Avoid high-fiber, fried, greasy, or spicy foods.  This information is not intended to replace advice given to you by your health care provider. Make sure you discuss any questions you have with your health care provider.  Document Released: 09/23/2003 Document Revised: 06/30/2016 Document Reviewed: 06/30/2016  Elsevier Interactive Patient Education  2018 [employer]

## 2017-07-27 NOTE — MAU Provider Note (Signed)
Chief Complaint: Emesis; Diarrhea; and Abdominal Pain   First Provider Initiated Contact with Patient 07/27/17 0544        SUBJECTIVE HPI: Gwendolyn Lawrence is a 27 y.o. G2P1001 at 3758w1d by LMP who presents to maternity admissions reporting vomiting for 2 days preceded by 1 week of diarrhea.  States has 3-4 stools per day.  Has had some chills . She denies vaginal bleeding, vaginal itching/burning, urinary symptoms, h/a, dizziness, or fever    Emesis   This is a new problem. The current episode started in the past 7 days. The problem has been unchanged. There has been no fever. Associated symptoms include abdominal pain, chills and diarrhea. Pertinent negatives include no dizziness, fever, headaches or myalgias. She has tried nothing for the symptoms. The treatment provided no relief.  Diarrhea   This is a new problem. The current episode started in the past 7 days. The problem occurs 2 to 4 times per day. The problem has been unchanged. Associated symptoms include abdominal pain, chills and vomiting. Pertinent negatives include no fever, headaches or myalgias. Nothing aggravates the symptoms. She has tried nothing for the symptoms.  Abdominal Pain  This is a new problem. The current episode started in the past 7 days. The onset quality is gradual. The problem occurs intermittently. The problem has been unchanged. The pain is located in the suprapubic region. The quality of the pain is cramping. The abdominal pain does not radiate. Associated symptoms include diarrhea and vomiting. Pertinent negatives include no fever, headaches or myalgias.    RN Note: Pt presents to MAU c/o vomiting x 2 days and diarrhea x1week. Pt also reports abdominal cramping in her mid lower abdomen that she is rating 5/10. Pt states she can not keep any fluids down longer than 5min. Pt states she hasnt really urinated today and it is dark when and if she does go.     Past Medical History:  Diagnosis Date  . History  of ITP    Past Surgical History:  Procedure Laterality Date  . WISDOM TOOTH EXTRACTION     Social History   Socioeconomic History  . Marital status: Married    Spouse name: Not on file  . Number of children: Not on file  . Years of education: Not on file  . Highest education level: Not on file  Social Needs  . Financial resource strain: Not on file  . Food insecurity - worry: Not on file  . Food insecurity - inability: Not on file  . Transportation needs - medical: Not on file  . Transportation needs - non-medical: Not on file  Occupational History  . Not on file  Tobacco Use  . Smoking status: Former Games developermoker  . Smokeless tobacco: Former Engineer, waterUser  Substance and Sexual Activity  . Alcohol use: No    Frequency: Never  . Drug use: No  . Sexual activity: Yes  Other Topics Concern  . Not on file  Social History Narrative  . Not on file   No current facility-administered medications on file prior to encounter.    Current Outpatient Medications on File Prior to Encounter  Medication Sig Dispense Refill  . Prenatal Vit-Fe Fumarate-FA (PRENATAL MULTIVITAMIN) TABS tablet Take 1 tablet by mouth daily at 12 noon.     Allergies  Allergen Reactions  . Amoxicillin Hives    I have reviewed patient's Past Medical Hx, Surgical Hx, Family Hx, Social Hx, medications and allergies.   ROS:  Review of Systems  Constitutional: Positive for chills. Negative for fever.  Gastrointestinal: Positive for abdominal pain, diarrhea and vomiting.  Musculoskeletal: Negative for myalgias.  Neurological: Negative for dizziness and headaches.   Review of Systems  Other systems negative   Physical Exam  Physical Exam Patient Vitals for the past 24 hrs:  BP Temp Temp src Pulse Resp SpO2 Height Weight  07/27/17 0511 123/67 97.7 F (36.5 C) Oral (!) 113 19 98 % 5\' 3"  (1.6 m) 107 lb (48.5 kg)   Constitutional: Well-developed female in no acute distress.  Cardiovascular: mildly tachycardic,  RRR Respiratory: normal effort GI: Abd soft, non-tender. Pos BS x 4 MS: Extremities nontender, no edema, normal ROM Neurologic: Alert and oriented x 4.  GU: Neg CVAT.  PELVIC EXAM: deferred  FHT 125 by doppler  LAB RESULTS . Results for orders placed or performed during the hospital encounter of 07/27/17 (from the past 24 hour(s))  Urinalysis, Routine w reflex microscopic     Status: Abnormal   Collection Time: 07/27/17  5:20 AM  Result Value Ref Range   Color, Urine YELLOW YELLOW   APPearance HAZY (A) CLEAR   Specific Gravity, Urine 1.031 (H) 1.005 - 1.030   pH 5.0 5.0 - 8.0   Glucose, UA NEGATIVE NEGATIVE mg/dL   Hgb urine dipstick NEGATIVE NEGATIVE   Bilirubin Urine NEGATIVE NEGATIVE   Ketones, ur 80 (A) NEGATIVE mg/dL   Protein, ur 161 (A) NEGATIVE mg/dL   Nitrite NEGATIVE NEGATIVE   Leukocytes, UA NEGATIVE NEGATIVE   RBC / HPF 0-5 0 - 5 RBC/hpf   WBC, UA 0-5 0 - 5 WBC/hpf   Bacteria, UA RARE (A) NONE SEEN   Squamous Epithelial / LPF 6-30 (A) NONE SEEN   Mucus PRESENT   CBC with Differential/Platelet     Status: Abnormal   Collection Time: 07/27/17  5:55 AM  Result Value Ref Range   WBC 20.9 (H) 4.0 - 10.5 K/uL   RBC 4.40 3.87 - 5.11 MIL/uL   Hemoglobin 13.5 12.0 - 15.0 g/dL   HCT 09.6 04.5 - 40.9 %   MCV 86.6 78.0 - 100.0 fL   MCH 30.7 26.0 - 34.0 pg   MCHC 35.4 30.0 - 36.0 g/dL   RDW 81.1 91.4 - 78.2 %   Platelets 183 150 - 400 K/uL   Neutrophils Relative % 94 %   Neutro Abs 19.6 (H) 1.7 - 7.7 K/uL   Lymphocytes Relative 4 %   Lymphs Abs 0.8 0.7 - 4.0 K/uL   Monocytes Relative 2 %   Monocytes Absolute 0.5 0.1 - 1.0 K/uL   Eosinophils Relative 0 %   Eosinophils Absolute 0.0 0.0 - 0.7 K/uL   Basophils Relative 0 %   Basophils Absolute 0.0 0.0 - 0.1 K/uL  Comprehensive metabolic panel     Status: Abnormal   Collection Time: 07/27/17  5:55 AM  Result Value Ref Range   Sodium 135 135 - 145 mmol/L   Potassium 3.7 3.5 - 5.1 mmol/L   Chloride 104 101 - 111  mmol/L   CO2 18 (L) 22 - 32 mmol/L   Glucose, Bld 160 (H) 65 - 99 mg/dL   BUN 18 6 - 20 mg/dL   Creatinine, Ser 9.56 0.44 - 1.00 mg/dL   Calcium 9.0 8.9 - 21.3 mg/dL   Total Protein 7.5 6.5 - 8.1 g/dL   Albumin 4.5 3.5 - 5.0 g/dL   AST 22 15 - 41 U/L   ALT 20 14 - 54 U/L   Alkaline  Phosphatase 54 38 - 126 U/L   Total Bilirubin 0.8 0.3 - 1.2 mg/dL   GFR calc non Af Amer >60 >60 mL/min   GFR calc Af Amer >60 >60 mL/min   Anion gap 13 5 - 15      IMAGING No results found.  MAU Management/MDM: Ordered CBC and Cmet to check for systemic disease.  Ordered urinalysis WBC is elevated, chemistries normal Discussed with Dr Alysia Penna who thinks given the negative abdominal exam, that this reflects gastroenteritis.  Will continue to hydrate then PO challenge.  ASSESSMENT Single IUP @ [redacted]w[redacted]d  Probable gastroenteritis  PLAN IV hydration with antiemetic Report given to oncoming provider  Wynelle Bourgeois CNM, MSN Certified Nurse-Midwife 07/27/2017  5:44 AM  Patient reports relief with IV fluids, no episodes of vomiting or diarrhea while in MAU.   -Discharge home in stable condition -Rx for phenergan PO and suppositories sent to patient's pharmacy -Food choices and course of gastroenteritis precautions discussed -Patient advised to follow-up with OB/GYN of choice for prenatal care -Patient may return to MAU as needed or if her condition were to change or worsen  Gwendolyn Lawrence, CNM 07/27/17 10:05 AM

## 2017-07-27 NOTE — MAU Note (Signed)
Pt presents to MAU c/o vomiting x 2 days and diarrhea x1week. Pt also reports abdominal cramping in her mid lower abdomen that she is rating 5/10. Pt states she can not keep any fluids down longer than . Pt states she hasnt really urinated today and it is dark when and if she does go.

## 2017-08-10 ENCOUNTER — Encounter: Payer: Self-pay | Admitting: Obstetrics and Gynecology

## 2018-04-25 ENCOUNTER — Encounter (HOSPITAL_COMMUNITY): Payer: Self-pay

## 2019-01-14 ENCOUNTER — Ambulatory Visit (HOSPITAL_COMMUNITY): Payer: Self-pay | Admitting: Psychiatry

## 2019-01-19 ENCOUNTER — Emergency Department (HOSPITAL_COMMUNITY)
Admission: EM | Admit: 2019-01-19 | Discharge: 2019-01-20 | Disposition: A | Discharge status: Other Acute Inpatient Hospital | Payer: Self-pay | Attending: Emergency Medicine | Admitting: Emergency Medicine

## 2019-01-19 ENCOUNTER — Encounter (HOSPITAL_COMMUNITY): Payer: Self-pay | Admitting: Emergency Medicine

## 2019-01-19 ENCOUNTER — Emergency Department (HOSPITAL_COMMUNITY): Payer: Self-pay

## 2019-01-19 ENCOUNTER — Other Ambulatory Visit: Payer: Self-pay

## 2019-01-19 DIAGNOSIS — Z91411 Personal history of adult psychological abuse: Secondary | ICD-10-CM | POA: Insufficient documentation

## 2019-01-19 DIAGNOSIS — Z79899 Other long term (current) drug therapy: Secondary | ICD-10-CM | POA: Insufficient documentation

## 2019-01-19 DIAGNOSIS — Z03818 Encounter for observation for suspected exposure to other biological agents ruled out: Secondary | ICD-10-CM | POA: Insufficient documentation

## 2019-01-19 DIAGNOSIS — F3289 Other specified depressive episodes: Secondary | ICD-10-CM

## 2019-01-19 DIAGNOSIS — F332 Major depressive disorder, recurrent severe without psychotic features: Secondary | ICD-10-CM | POA: Insufficient documentation

## 2019-01-19 DIAGNOSIS — F17200 Nicotine dependence, unspecified, uncomplicated: Secondary | ICD-10-CM | POA: Insufficient documentation

## 2019-01-19 DIAGNOSIS — F419 Anxiety disorder, unspecified: Secondary | ICD-10-CM

## 2019-01-19 DIAGNOSIS — F411 Generalized anxiety disorder: Secondary | ICD-10-CM | POA: Insufficient documentation

## 2019-01-19 DIAGNOSIS — Z91410 Personal history of adult physical and sexual abuse: Secondary | ICD-10-CM | POA: Insufficient documentation

## 2019-01-19 HISTORY — DX: Anxiety disorder, unspecified: F41.9

## 2019-01-19 HISTORY — DX: Depression, unspecified: F32.A

## 2019-01-19 LAB — COMPREHENSIVE METABOLIC PANEL
ALT: 17 U/L (ref 0–44)
AST: 18 U/L (ref 15–41)
Albumin: 4.6 g/dL (ref 3.5–5.0)
Alkaline Phosphatase: 48 U/L (ref 38–126)
Anion gap: 9 (ref 5–15)
BUN: 14 mg/dL (ref 6–20)
CO2: 22 mmol/L (ref 22–32)
Calcium: 9.3 mg/dL (ref 8.9–10.3)
Chloride: 105 mmol/L (ref 98–111)
Creatinine, Ser: 0.67 mg/dL (ref 0.44–1.00)
GFR calc Af Amer: >60 mL/min (ref >60)
GFR calc non Af Amer: >60 mL/min (ref >60)
Glucose, Bld: 90 mg/dL (ref 70–99)
Potassium: 4 mmol/L (ref 3.5–5.1)
Sodium: 136 mmol/L (ref 135–145)
Total Bilirubin: 0.6 mg/dL (ref 0.3–1.2)
Total Protein: 7.5 g/dL (ref 6.5–8.1)

## 2019-01-19 LAB — ACETAMINOPHEN LEVEL: Acetaminophen (Tylenol), Serum: <10 ug/mL — ABNORMAL LOW (ref 10–30)

## 2019-01-19 LAB — RAPID URINE DRUG SCREEN, HOSP PERFORMED
Amphetamines: NOT DETECTED
Barbiturates: NOT DETECTED
Benzodiazepines: NOT DETECTED
Cocaine: NOT DETECTED
Opiates: NOT DETECTED
Tetrahydrocannabinol: POSITIVE — AB

## 2019-01-19 LAB — ETHANOL: Alcohol, Ethyl (B): <10 mg/dL (ref <10)

## 2019-01-19 LAB — CBC
HCT: 43.1 % (ref 36.0–46.0)
Hemoglobin: 14.5 g/dL (ref 12.0–15.0)
MCH: 30.9 pg (ref 26.0–34.0)
MCHC: 33.6 g/dL (ref 30.0–36.0)
MCV: 91.9 fL (ref 80.0–100.0)
Platelets: 223 10*3/uL (ref 150–400)
RBC: 4.69 MIL/uL (ref 3.87–5.11)
RDW: 12.5 % (ref 11.5–15.5)
WBC: 24 10*3/uL — ABNORMAL HIGH (ref 4.0–10.5)
nRBC: 0 % (ref 0.0–0.2)

## 2019-01-19 LAB — I-STAT BETA HCG BLOOD, ED (MC, WL, AP ONLY): I-stat hCG, quantitative: <5 m[IU]/mL (ref <5)

## 2019-01-19 LAB — TSH: TSH: 1.056 u[IU]/mL (ref 0.350–4.500)

## 2019-01-19 LAB — SALICYLATE LEVEL: Salicylate Lvl: <7 mg/dL (ref 2.8–30.0)
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
## 2019-01-19 NOTE — ED Notes (Signed)
TTS started  

## 2019-01-19 NOTE — ED Notes (Signed)
Husband- adalena abdulla, [phone] if pt is able to call

## 2019-01-19 NOTE — ED Notes (Signed)
Spouse called, wants pt to call and update

## 2019-01-19 NOTE — [facility] Counselor (Signed)
Clinician called TTS cart and received the busy signal. Clinician to call back.     Vertell Novak, [place], [facility], [facility] Triage Specialist [phone]

## 2019-01-19 NOTE — ED Triage Notes (Signed)
Pt presents after speaking with [facility], pt states she feels like her depression is out of control and she feels like everyone would be better of without her. Pt states she has appt with MD in 2 weeks but does not feel like she can wait. Pt reports no plan but feels like when she gets very upset she feels out of control  Pt tearful in triage.

## 2019-01-19 NOTE — ED Notes (Signed)
Pt changed into purple scrubs, belongings in belongings bag at green zone nurses station, pt sitting in green hallway

## 2019-01-20 ENCOUNTER — Inpatient Hospital Stay (HOSPITAL_COMMUNITY)
Admission: AD | Admit: 2019-01-20 | Discharge: 2019-01-23 | DRG: 885 | Disposition: A | Discharge status: Home / Self Care | Payer: Federal, State, Local not specified - Other | Source: Intra-hospital | Attending: Psychiatry | Admitting: Psychiatry

## 2019-01-20 ENCOUNTER — Other Ambulatory Visit: Payer: Self-pay

## 2019-01-20 ENCOUNTER — Encounter (HOSPITAL_COMMUNITY): Payer: Self-pay | Admitting: Emergency Medicine

## 2019-01-20 DIAGNOSIS — Z833 Family history of diabetes mellitus: Secondary | ICD-10-CM | POA: Diagnosis not present

## 2019-01-20 DIAGNOSIS — Z8249 Family history of ischemic heart disease and other diseases of the circulatory system: Secondary | ICD-10-CM

## 2019-01-20 DIAGNOSIS — F1290 Cannabis use, unspecified, uncomplicated: Secondary | ICD-10-CM | POA: Diagnosis present

## 2019-01-20 DIAGNOSIS — Z818 Family history of other mental and behavioral disorders: Secondary | ICD-10-CM

## 2019-01-20 DIAGNOSIS — F332 Major depressive disorder, recurrent severe without psychotic features: Secondary | ICD-10-CM | POA: Diagnosis not present

## 2019-01-20 DIAGNOSIS — F4310 Post-traumatic stress disorder, unspecified: Secondary | ICD-10-CM | POA: Diagnosis present

## 2019-01-20 DIAGNOSIS — F333 Major depressive disorder, recurrent, severe with psychotic symptoms: Principal | ICD-10-CM | POA: Diagnosis present

## 2019-01-20 LAB — URINALYSIS, ROUTINE W REFLEX MICROSCOPIC
Bacteria, UA: NONE SEEN
Bilirubin Urine: NEGATIVE
Glucose, UA: NEGATIVE mg/dL
Ketones, ur: NEGATIVE mg/dL
Leukocytes,Ua: NEGATIVE
Nitrite: NEGATIVE
Protein, ur: 30 mg/dL — AB
Specific Gravity, Urine: 1.034 — ABNORMAL HIGH (ref 1.005–1.030)
pH: 5 (ref 5.0–8.0)

## 2019-01-20 LAB — CBC WITH DIFFERENTIAL/PLATELET
Abs Immature Granulocytes: 0.03 10*3/uL (ref 0.00–0.07)
Basophils Absolute: 0 10*3/uL (ref 0.0–0.1)
Basophils Relative: 0 %
Eosinophils Absolute: 0.1 10*3/uL (ref 0.0–0.5)
Eosinophils Relative: 1 %
HCT: 40.4 % (ref 36.0–46.0)
Hemoglobin: 13.4 g/dL (ref 12.0–15.0)
Immature Granulocytes: 0 %
Lymphocytes Relative: 41 %
Lymphs Abs: 3.7 10*3/uL (ref 0.7–4.0)
MCH: 30.6 pg (ref 26.0–34.0)
MCHC: 33.2 g/dL (ref 30.0–36.0)
MCV: 92.2 fL (ref 80.0–100.0)
Monocytes Absolute: 0.5 10*3/uL (ref 0.1–1.0)
Monocytes Relative: 6 %
Neutro Abs: 4.7 10*3/uL (ref 1.7–7.7)
Neutrophils Relative %: 52 %
Platelets: 224 10*3/uL (ref 150–400)
RBC: 4.38 MIL/uL (ref 3.87–5.11)
RDW: 12.3 % (ref 11.5–15.5)
WBC: 9 10*3/uL (ref 4.0–10.5)
nRBC: 0 % (ref 0.0–0.2)

## 2019-01-20 LAB — TECHNOLOGIST SMEAR REVIEW

## 2019-01-20 LAB — SARS CORONAVIRUS 2 BY RT PCR (HOSPITAL ORDER, PERFORMED IN ~~LOC~~ HOSPITAL LAB): SARS Coronavirus 2: NEGATIVE

## 2019-01-20 MED ORDER — ENSURE ENLIVE PO LIQD
237.0000 mL | Freq: Two times a day (BID) | ORAL | Status: DC
  Administered 2019-01-20: 237 mL via ORAL

## 2019-01-20 MED ORDER — NICOTINE 21 MG/24HR TD PT24
21.0000 mg | MEDICATED_PATCH | Freq: Every day | TRANSDERMAL | Status: DC
  Filled 2019-01-20 (×3): qty 1

## 2019-01-20 MED ORDER — NICOTINE 21 MG/24HR TD PT24
21.0000 mg | MEDICATED_PATCH | Freq: Once | TRANSDERMAL | Status: DC
  Administered 2019-01-20: 21 mg via TRANSDERMAL
  Filled 2019-01-20: qty 1

## 2019-01-20 MED ORDER — TRAZODONE HCL 100 MG PO TABS
100.0000 mg | ORAL_TABLET | Freq: Every evening | ORAL | Status: DC | PRN
  Filled 2019-01-20: qty 1

## 2019-01-20 MED ORDER — CITALOPRAM HYDROBROMIDE 10 MG PO TABS
10.0000 mg | ORAL_TABLET | ORAL | Status: DC
  Filled 2019-01-20: qty 1

## 2019-01-20 MED ORDER — HYDROXYZINE HCL 25 MG PO TABS
25.0000 mg | ORAL_TABLET | Freq: Three times a day (TID) | ORAL | Status: DC | PRN
  Filled 2019-01-20: qty 1

## 2019-01-20 MED ORDER — NICOTINE 21 MG/24HR TD PT24
21.0000 mg | MEDICATED_PATCH | Freq: Every day | TRANSDERMAL | Status: DC
  Administered 2019-01-20 – 2019-01-23 (×4): 21 mg via TRANSDERMAL
  Filled 2019-01-20 (×5): qty 1

## 2019-01-20 MED ORDER — MIRTAZAPINE 15 MG PO TBDP
15.0000 mg | ORAL_TABLET | Freq: Every day | ORAL | Status: DC
  Administered 2019-01-20 – 2019-01-22 (×3): 15 mg via ORAL
  Filled 2019-01-20 (×5): qty 1
  Filled 2019-01-20: qty 7

## 2019-01-20 MED ORDER — LORAZEPAM 1 MG PO TABS
1.0000 mg | ORAL_TABLET | Freq: Once | ORAL | Status: AC
  Administered 2019-01-20: 1 mg via ORAL
  Filled 2019-01-20: qty 1

## 2019-01-20 MED ORDER — ALUM & MAG HYDROXIDE-SIMETH 200-200-20 MG/5ML PO SUSP: 30.0000 mL | ORAL | Status: DC | PRN

## 2019-01-20 MED ORDER — CITALOPRAM HYDROBROMIDE 20 MG PO TABS
20.0000 mg | ORAL_TABLET | Freq: Every day | ORAL | Status: DC
  Filled 2019-01-20: qty 1

## 2019-01-20 MED ORDER — PRAZOSIN HCL 1 MG PO CAPS
1.0000 mg | ORAL_CAPSULE | Freq: Every day | ORAL | Status: DC
  Administered 2019-01-20 – 2019-01-22 (×3): 1 mg via ORAL
  Filled 2019-01-20 (×5): qty 1
  Filled 2019-01-20: qty 7

## 2019-01-20 MED ORDER — ACETAMINOPHEN 325 MG PO TABS: 650.0000 mg | ORAL_TABLET | Freq: Four times a day (QID) | ORAL | Status: DC | PRN
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
## 2019-01-20 NOTE — ED Notes (Signed)
Spouse came and picked up belongings.  RN woke pt to verify she wanted him to have her belongings.  Everything was sent home except a pair of pants and a shirt.  Spouse updated on pt admission to [facility] in the AM.
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
## 2019-01-20 NOTE — ED Notes (Signed)
(  [phone] Husband number. Pt. Stated that she would like to be able to contact husband when she is able to.

## 2019-01-20 NOTE — ED Notes (Signed)
Ordered bfast 

## 2019-01-20 NOTE — ED Notes (Signed)
Pt continues to be upset about going to the hospital tonight, recanting earlier statements to therapist.  RN allowed pt to contact spouse and speak on the phone.  She appeared to calm down and apologized for yelling earlier.

## 2019-01-20 NOTE — ED Notes (Signed)
IVC paperwork faxed to [facility] and IVC paperwork sent to medical records.
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
## 2019-01-20 NOTE — Progress Notes (Addendum)
Pt accepted to [facility], Bed 404-2 Lurline Del, NP is the accepting provider.  Myles Lipps, MD is the attending provider.  Call report to [phone]  [facility] ED notified.   Pt is Voluntary.  Pt may be transported by [facility]  Pt scheduled  to arrive at [facility] as soon as transport can be arranged  Romie Minus T. Judi Cong, MSW, [place] Disposition Clinical Social Work [phone] (cell) [phone] (office)
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
## 2019-01-20 NOTE — ED Notes (Signed)
Called [facility] for transport to [facility]

## 2019-01-20 NOTE — Progress Notes (Signed)
D   Pt is pleasant on approach and cooperative   She reports poor sleep and was glad to have medications for sleep she wanted to take the two new sleep medications and the medication for PTSD   Pt behavior is appropriate and she interacts well with others A    Verbal support given    Medications administered and effectiveness monitored   Q 15 min checks  Educated on medications and good sleeping habits R   Pt is safe at this time  [place] CORONAVIRUS (COVID-19) DAILY CHECK-OFF SYMPTOMS - answer yes or no to each - every day NO YES  Have you had a fever in the past 24 hours?  . Fever (Temp > 37.80C / 100F) X   Have you had any of these symptoms in the past 24 hours? . New Cough .  Sore Throat  .  Shortness of Breath .  Difficulty Breathing .  Unexplained Body Aches   X   Have you had any one of these symptoms in the past 24 hours not related to allergies?   . Runny Nose .  Nasal Congestion .  Sneezing   X   If you have had runny nose, nasal congestion, sneezing in the past 24 hours, has it worsened?  X   EXPOSURES - check yes or no X   Have you traveled outside the state in the past 14 days?  X   Have you been in contact with someone with a confirmed diagnosis of COVID-19 or PUI in the past 14 days without wearing appropriate PPE?  X   Have you been living in the same home as a person with confirmed diagnosis of COVID-19 or a PUI (household contact)?    X   Have you been diagnosed with COVID-19?    X              What to do next: Answered NO to all: Answered YES to anything:   Proceed with unit schedule Follow the [facility] Inpatient Flowsheet.

## 2019-01-20 NOTE — ED Notes (Addendum)
Pt received breakfast tray 

## 2019-01-20 NOTE — ED Notes (Signed)
Report given to [facility] RN. Pt is currently being transferred to [facility] with [facility]. [employer] given pts belongings and IVC paperwork.

## 2019-01-20 NOTE — BHH Counselor (Addendum)
Per Shana Chute, RN day shift AC to call RN once there is an appropraite bed available.   Shana Chute, RN spoke to [place], Therapist, sports.   Vertell Novak, [place], [facility], [facility] Triage Specialist [phone]

## 2019-01-20 NOTE — Progress Notes (Signed)
Patient ID: Gwendolyn Lawrence, adult   DOB: [birth year], 28 y.o.   MRN: [id number]  [facility] [place] CORONAVIRUS (COVID-19) DAILY CHECK-OFF SYMPTOMS - answer yes or no to each - every day NO YES  Have you had a fever in the past 24 hours?  . Fever (Temp > 37.80C / 100F) X   Have you had any of these symptoms in the past 24 hours? . New Cough .  Sore Throat  .  Shortness of Breath .  Difficulty Breathing .  Unexplained Body Aches   X   Have you had any one of these symptoms in the past 24 hours not related to allergies?   . Runny Nose .  Nasal Congestion .  Sneezing   X   If you have had runny nose, nasal congestion, sneezing in the past 24 hours, has it worsened?  X   EXPOSURES - check yes or no X   Have you traveled outside the state in the past 14 days?  X   Have you been in contact with someone with a confirmed diagnosis of COVID-19 or PUI in the past 14 days without wearing appropriate PPE?  X   Have you been living in the same home as a person with confirmed diagnosis of COVID-19 or a PUI (household contact)?    X   Have you been diagnosed with COVID-19?    X              What to do next: Answered NO to all: Answered YES to anything:   Proceed with unit schedule Follow the [facility] Inpatient Flowsheet.
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
## 2019-01-21 DIAGNOSIS — F333 Major depressive disorder, recurrent, severe with psychotic symptoms: Principal | ICD-10-CM

## 2019-01-21 DIAGNOSIS — F4310 Post-traumatic stress disorder, unspecified: Secondary | ICD-10-CM

## 2019-01-21 MED ORDER — ADULT MULTIVITAMIN W/MINERALS CH
1.0000 | ORAL_TABLET | Freq: Every day | ORAL | Status: DC
  Administered 2019-01-21 – 2019-01-23 (×3): 1 via ORAL
  Filled 2019-01-21 (×5): qty 1

## 2019-01-21 MED ORDER — ENSURE ENLIVE PO LIQD
237.0000 mL | Freq: Three times a day (TID) | ORAL | Status: DC
  Administered 2019-01-21 – 2019-01-23 (×6): 237 mL via ORAL
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
## 2019-01-21 NOTE — Progress Notes (Signed)
[place] NOVEL CORONAVIRUS (COVID-19) DAILY CHECK-OFF SYMPTOMS - answer yes or no to each - every day NO YES  Have you had a fever in the past 24 hours?  . Fever (Temp > 37.80C / 100F) X   Have you had any of these symptoms in the past 24 hours? . New Cough .  Sore Throat  .  Shortness of Breath .  Difficulty Breathing .  Unexplained Body Aches   X   Have you had any one of these symptoms in the past 24 hours not related to allergies?   . Runny Nose .  Nasal Congestion .  Sneezing   X   If you have had runny nose, nasal congestion, sneezing in the past 24 hours, has it worsened?  X   EXPOSURES - check yes or no X   Have you traveled outside the state in the past 14 days?  X   Have you been in contact with someone with a confirmed diagnosis of COVID-19 or PUI in the past 14 days without wearing appropriate PPE?  X   Have you been living in the same home as a person with confirmed diagnosis of COVID-19 or a PUI (household contact)?    X   Have you been diagnosed with COVID-19?    X              What to do next: Answered NO to all: Answered YES to anything:   Proceed with unit schedule Follow the [facility] Inpatient Flowsheet.   

## 2019-01-21 NOTE — Progress Notes (Signed)
The focus of this group is to help patients establish daily goals to achieve during treatment and discuss how the patient can incorporate goal setting into their daily lives to aide in recovery. 
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
## 2019-01-21 NOTE — Plan of Care (Signed)
Progress note  D: pt found in bed; compliant with medication administration. Pt was bright and animated in her assessment. Pt denies any physical symptoms or pain. Pt denies si/hi/ah/vh and verbally agrees to approach staff if these become apparent or before harming himself/others while at [place]: Pt provided support and encouragement. Pt given medication per protocol and standing orders. Q96m safety checks implemented and continued.  R: Pt safe on the unit. Will continue to monitor.  Pt progressing in the following metrics  Problem: Education: Goal: Knowledge of [place] [facility] Education information/materials will improve Outcome: Progressing Goal: Emotional status will improve Outcome: Progressing Goal: Mental status will improve Outcome: Progressing Goal: Verbalization of understanding the information provided will improve Outcome: Progressing
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
## 2019-01-22 LAB — HEPATITIS PANEL, ACUTE
HCV Ab: <0.1 s/co ratio (ref 0.0–0.9)
Hep A IgM: NEGATIVE
Hep B C IgM: NEGATIVE
Hepatitis B Surface Ag: NEGATIVE

## 2019-01-22 MED ORDER — TRAZODONE HCL 50 MG PO TABS: 50.0000 mg | ORAL_TABLET | Freq: Every evening | ORAL | Status: DC | PRN

## 2019-01-22 NOTE — Progress Notes (Signed)
Adult Psychoeducational Group Note  Date:  01/22/2019 Time:  9:35 PM  Group Topic/Focus:  Wrap-Up Group:   The focus of this group is to help patients review their daily goal of treatment and discuss progress on daily workbooks.  Participation Level:  Active  Participation Quality:  Appropriate  Affect:  Appropriate  Cognitive:  Alert  Insight: Appropriate  Engagement in Group:  Engaged  Modes of Intervention:  Discussion  Additional Comments:  Patient stated she was irritated over a phone call earlier, but is fine now. Patient's goal for today was to "self control".   Daryle Amis L Madelein Mahadeo 01/22/2019, 9:35 PM

## 2019-01-22 NOTE — Tx Team (Signed)
Interdisciplinary Treatment and Diagnostic Plan Update  01/22/2019 Time of Session: 9:00am Gwendolyn RiddleSara Ifreeta Lawrence MRN: [id number]  Principal Diagnosis: <principal problem not specified>  Secondary Diagnoses: Active Problems:   MDD (major depressive disorder), recurrent, severe, with psychosis (HCC)   Current Medications:  Current Facility-Administered Medications  Medication Dose Route Frequency Provider Last Rate Last Dose  . acetaminophen (TYLENOL) tablet 650 mg  650 mg Oral Q6H PRN Denzil Magnusonhomas, Lashunda, NP      . alum & mag hydroxide-simeth (MAALOX/MYLANTA) 200-200-20 MG/5ML suspension 30 mL  30 mL Oral Q4H PRN Denzil Magnusonhomas, Lashunda, NP      . feeding supplement (ENSURE ENLIVE) (ENSURE ENLIVE) liquid 237 mL  237 mL Oral TID BM Antonieta Pertlary, Greg Lawson, MD   237 mL at 01/22/19 1051  . hydrOXYzine (ATARAX/VISTARIL) tablet 25 mg  25 mg Oral TID PRN Aldean BakerSykes, Janet E, NP      . mirtazapine (REMERON SOL-TAB) disintegrating tablet 15 mg  15 mg Oral QHS Aldean BakerSykes, Janet E, NP   15 mg at 01/21/19 2107  . multivitamin with minerals tablet 1 tablet  1 tablet Oral Daily Antonieta Pertlary, Greg Lawson, MD   1 tablet at 01/22/19 [phone]  . nicotine (NICODERM CQ - dosed in mg/24 hours) patch 21 mg  21 mg Transdermal Daily Antonieta Pertlary, Greg Lawson, MD   21 mg at 01/22/19 0727  . prazosin (MINIPRESS) capsule 1 mg  1 mg Oral QHS Aldean BakerSykes, Janet E, NP   1 mg at 01/21/19 2106  . traZODone (DESYREL) tablet 100 mg  100 mg Oral QHS PRN Aldean BakerSykes, Janet E, NP       PTA Medications: Medications Prior to Admission  Medication Sig Dispense Refill Last Dose  . NON FORMULARY Take 1 tablet by mouth daily. Birth Control Pill     . traZODone (DESYREL) 100 MG tablet Take 100 mg by mouth at bedtime.       Patient Stressors: Marital or family conflict Traumatic event  Patient Strengths: Barrister's clerkCommunication skills Motivation for treatment/growth Supportive family/friends  Treatment Modalities: Medication Management, Group therapy, Case management,  1 to 1 session with  clinician, Psychoeducation, Recreational therapy.   Physician Treatment Plan for Primary Diagnosis: <principal problem not specified> Long Term Goal(s): Improvement in symptoms so as ready for discharge Improvement in symptoms so as ready for discharge   Short Term Goals: Ability to identify changes in lifestyle to reduce recurrence of condition will improve Ability to verbalize feelings will improve Ability to disclose and discuss suicidal ideas Ability to demonstrate self-control will improve Ability to identify and develop effective coping behaviors will improve  Medication Management: Evaluate patient's response, side effects, and tolerance of medication regimen.  Therapeutic Interventions: 1 to 1 sessions, Unit Group sessions and Medication administration.  Evaluation of Outcomes: Progressing  Physician Treatment Plan for Secondary Diagnosis: Active Problems:   MDD (major depressive disorder), recurrent, severe, with psychosis (HCC)  Long Term Goal(s): Improvement in symptoms so as ready for discharge Improvement in symptoms so as ready for discharge   Short Term Goals: Ability to identify changes in lifestyle to reduce recurrence of condition will improve Ability to verbalize feelings will improve Ability to disclose and discuss suicidal ideas Ability to demonstrate self-control will improve Ability to identify and develop effective coping behaviors will improve     Medication Management: Evaluate patient's response, side effects, and tolerance of medication regimen.  Therapeutic Interventions: 1 to 1 sessions, Unit Group sessions and Medication administration.  Evaluation of Outcomes: Progressing   RN Treatment Plan for Primary  Diagnosis: <principal problem not specified> Long Term Goal(s): Knowledge of disease and therapeutic regimen to maintain health will improve  Short Term Goals: Ability to identify and develop effective coping behaviors will improve and  Compliance with prescribed medications will improve  Medication Management: RN will administer medications as ordered by provider, will assess and evaluate patient's response and provide education to patient for prescribed medication. RN will report any adverse and/or side effects to prescribing provider.  Therapeutic Interventions: 1 on 1 counseling sessions, Psychoeducation, Medication administration, Evaluate responses to treatment, Monitor vital signs and CBGs as ordered, Perform/monitor CIWA, COWS, AIMS and Fall Risk screenings as ordered, Perform wound care treatments as ordered.  Evaluation of Outcomes: Progressing   LCSW Treatment Plan for Primary Diagnosis: <principal problem not specified> Long Term Goal(s): Safe transition to appropriate next level of care at discharge, Engage patient in therapeutic group addressing interpersonal concerns.  Short Term Goals: Engage patient in aftercare planning with referrals and resources, Increase social support, Identify triggers associated with mental health/substance abuse issues and Increase skills for wellness and recovery  Therapeutic Interventions: Assess for all discharge needs, 1 to 1 time with Social worker, Explore available resources and support systems, Assess for adequacy in community support network, Educate family and significant other(s) on suicide prevention, Complete Psychosocial Assessment, Interpersonal group therapy.  Evaluation of Outcomes: Progressing   Progress in Treatment: Attending groups: Yes. Participating in groups: Yes. Taking medication as prescribed: Yes. Toleration medication: Yes. Family/Significant other contact made: No, will contact:  husband Patient understands diagnosis: Yes. Discussing patient identified problems/goals with staff: Yes. Medical problems stabilized or resolved: Yes. Denies suicidal/homicidal ideation: Yes. Issues/concerns per patient self-inventory: No.  New problem(s) identified:  Yes, Describe:  CSW continuing to assess  New Short Term/Long Term Goal(s): medication management for mood stabilization; elimination of SI thoughts; development of comprehensive mental wellness/sobriety plan.  Patient Goals:    Discharge Plan or Barriers: CSW continuing to assess for appropriate referrals.  Reason for Continuation of Hospitalization: Anxiety Depression  Estimated Length of Stay: 1-2 days  Attendees: Patient: 01/22/2019 11:28 AM  Physician:  01/22/2019 11:28 AM  Nursing:  01/22/2019 11:28 AM  RN Care Manager: 01/22/2019 11:28 AM  Social Worker: Stephanie Acre, [place] 01/22/2019 11:28 AM  Recreational Therapist:  01/22/2019 11:28 AM  Other:  01/22/2019 11:28 AM  Other:  01/22/2019 11:28 AM  Other: 01/22/2019 11:28 AM    Scribe for Treatment Team: Joellen Jersey, [place] 01/22/2019 11:28 AM

## 2019-01-22 NOTE — Progress Notes (Signed)
Adult Psychoeducational Group Note  Date:  01/22/2019 Time:  6:13 PM  Group Topic/Focus:  Goals Group:   The focus of this group is to help patients establish daily goals to achieve during treatment and discuss how the patient can incorporate goal setting into their daily lives to aide in recovery.  Participation Level:  Active  Participation Quality:  Appropriate  Affect:  Appropriate  Cognitive:  Alert  Insight: Appropriate  Engagement in Group:  Engaged  Modes of Intervention:  Discussion  Additional Comments:  Pt attended group and participated in discission.  Jamee Pacholski R Gurneet Matarese 01/22/2019, 6:13 PM

## 2019-01-22 NOTE — Plan of Care (Signed)
Progress note  D: pt found on the phone; compliant with medication administration. Pt is still animated and in good spirits. Pt denies any physical problems or pain. Pt denies si/hi/ah/vh and verbally agrees to approach staff if these become apparent or before harming himself/others while at [place]: Pt provided support and encouragement. Pt given medication per protocol and standing orders. Q30m safety checks implemented and continued.  R: Pt safe on the unit. Will continue to monitor.  Pt progressing in the following metrics  Problem: Activity: Goal: Interest or engagement in activities will improve Outcome: Progressing Goal: Sleeping patterns will improve Outcome: Progressing   Problem: Coping: Goal: Ability to verbalize frustrations and anger appropriately will improve Outcome: Progressing

## 2019-01-22 NOTE — Progress Notes (Signed)
[facility] MD Progress Note  01/22/2019 4:13 PM Aubreana Cornacchia  MRN:  [id number] Subjective: Patient describes improving mood.  States "I feel a lot better".  Denies suicidal ideations.  At this time she is hoping for discharge soon, looking forward to reunite with her family/children. Objective : I have reviewed case with treatment team and have met with patient. 28 year old married female, has two children, homemaker , enrolled in college Presented for worsening depression, anxiety, panic, PTSD symptoms.  Patient presents alert, attentive, with a reactive/fuller range of affect.  States she is feeling better.  No SI. Has been visible on unit/dayroom, interacting with peers, participating in groups.  Behavior on unit in good control. Pleasant on approach. Currently denies medication side effects.  She is on Remeron/Minipress which she is tolerating well thus far.   Principal Problem:  Diagnosis: Active Problems:   MDD (major depressive disorder), recurrent, severe, with psychosis ([place])  Total Time spent with patient: 20 minutes  Past Psychiatric History:   Past Medical History:  Past Medical History:  Diagnosis Date  . Anxiety   . Depression   . History of ITP     Past Surgical History:  Procedure Laterality Date  . WISDOM TOOTH EXTRACTION     Family History:  Family History  Problem Relation Age of Onset  . Hypertension Mother   . Hypertension Father   . Diabetes Maternal Grandmother   . Diabetes Maternal Grandfather   . Heart disease Maternal Grandfather   . Diabetes Paternal Grandmother   . Diabetes Paternal Grandfather    Family Psychiatric  History:  Social History:  Social History   Substance and Sexual Activity  Alcohol Use No  . Frequency: Never     Social History   Substance and Sexual Activity  Drug Use No    Social History   Socioeconomic History  . Marital status: Married    Spouse name: Not on file  . Number of children: Not on file  . Years of  education: Not on file  . Highest education level: Not on file  Occupational History  . Not on file  Social Needs  . Financial resource strain: Not on file  . Food insecurity    Worry: Not on file    Inability: Not on file  . Transportation needs    Medical: Not on file    Non-medical: Not on file  Tobacco Use  . Smoking status: Current Some Day Smoker  . Smokeless tobacco: Former Network engineer and Sexual Activity  . Alcohol use: No    Frequency: Never  . Drug use: No  . Sexual activity: Yes  Lifestyle  . Physical activity    Days per week: Not on file    Minutes per session: Not on file  . Stress: Not on file  Relationships  . Social Herbalist on phone: Not on file    Gets together: Not on file    Attends religious service: Not on file    Active member of club or organization: Not on file    Attends meetings of clubs or organizations: Not on file    Relationship status: Not on file  Other Topics Concern  . Not on file  Social History Narrative  . Not on file   Additional Social History:   Sleep: improving   Appetite:  improving  Current Medications: Current Facility-Administered Medications  Medication Dose Route Frequency Provider Last Rate Last Dose  . acetaminophen (  TYLENOL) tablet 650 mg  650 mg Oral Q6H PRN Mordecai Maes, NP      . alum & mag hydroxide-simeth (MAALOX/MYLANTA) 200-200-20 MG/5ML suspension 30 mL  30 mL Oral Q4H PRN Mordecai Maes, NP      . feeding supplement (ENSURE ENLIVE) (ENSURE ENLIVE) liquid 237 mL  237 mL Oral TID BM Sharma Covert, MD   237 mL at 01/22/19 1051  . hydrOXYzine (ATARAX/VISTARIL) tablet 25 mg  25 mg Oral TID PRN Connye Burkitt, NP      . mirtazapine (REMERON SOL-TAB) disintegrating tablet 15 mg  15 mg Oral QHS Connye Burkitt, NP   15 mg at 01/21/19 2107  . multivitamin with minerals tablet 1 tablet  1 tablet Oral Daily Sharma Covert, MD   1 tablet at 01/22/19 [phone]  . nicotine (NICODERM CQ - dosed  in mg/24 hours) patch 21 mg  21 mg Transdermal Daily Sharma Covert, MD   21 mg at 01/22/19 0727  . prazosin (MINIPRESS) capsule 1 mg  1 mg Oral QHS Connye Burkitt, NP   1 mg at 01/21/19 2106  . traZODone (DESYREL) tablet 100 mg  100 mg Oral QHS PRN Connye Burkitt, NP        Lab Results:  Results for orders placed or performed during the hospital encounter of 01/20/19 (from the past 48 hour(s))  CBC with Differential/Platelet     Status: None   Collection Time: 01/20/19  6:05 PM  Result Value Ref Range   WBC 9.0 4.0 - 10.5 K/uL   RBC 4.38 3.87 - 5.11 MIL/uL   Hemoglobin 13.4 12.0 - 15.0 g/dL   HCT 40.4 36.0 - 46.0 %   MCV 92.2 80.0 - 100.0 fL   MCH 30.6 26.0 - 34.0 pg   MCHC 33.2 30.0 - 36.0 g/dL   RDW 12.3 11.5 - 15.5 %   Platelets 224 150 - 400 K/uL   nRBC 0.0 0.0 - 0.2 %   Neutrophils Relative % 52 %   Neutro Abs 4.7 1.7 - 7.7 K/uL   Lymphocytes Relative 41 %   Lymphs Abs 3.7 0.7 - 4.0 K/uL   Monocytes Relative 6 %   Monocytes Absolute 0.5 0.1 - 1.0 K/uL   Eosinophils Relative 1 %   Eosinophils Absolute 0.1 0.0 - 0.5 K/uL   Basophils Relative 0 %   Basophils Absolute 0.0 0.0 - 0.1 K/uL   Immature Granulocytes 0 %   Abs Immature Granulocytes 0.03 0.00 - 0.07 K/uL    Comment: Performed at [facility], [place] [address], [place], [place] [address]  Technologist smear review     Status: None   Collection Time: 01/20/19  6:05 PM  Result Value Ref Range   Tech Review MORPHOLOGY UNREMARKABLE     Comment: Performed at [facility], [place] [address], [place], [place] [address]  Hepatitis panel, acute     Status: None   Collection Time: 01/20/19  6:05 PM  Result Value Ref Range   Hepatitis B Surface Ag Negative Negative   HCV Ab <0.1 0.0 - 0.9 s/co ratio    Comment: (NOTE)                                  Negative:     < 0.8  Indeterminate: 0.8 - 0.9                                  Positive:     > 0.9 The CDC  recommends that a positive HCV antibody result be followed up with a HCV Nucleic Acid Amplification test (778242). Performed At: [facility] [place], [place] [id number] Rush Farmer MD VQ:[id number]    Hep A IgM Negative Negative   Hep B C IgM Negative Negative    Blood Alcohol level:  Lab Results  Component Value Date   ETH <10 93/26/7124    Metabolic Disorder Labs: No results found for: HGBA1C, MPG No results found for: PROLACTIN No results found for: CHOL, TRIG, HDL, CHOLHDL, VLDL, LDLCALC  Physical Findings: AIMS: Facial and Oral Movements Muscles of Facial Expression: None, normal Lips and Perioral Area: None, normal Jaw: None, normal Tongue: None, normal,Extremity Movements Upper (arms, wrists, hands, fingers): None, normal Lower (legs, knees, ankles, toes): None, normal, Trunk Movements Neck, shoulders, hips: None, normal, Overall Severity Severity of abnormal movements (highest score from questions above): None, normal Incapacitation due to abnormal movements: None, normal Patient's awareness of abnormal movements (rate only patient's report): No Awareness, Dental Status Current problems with teeth and/or dentures?: No Does patient usually wear dentures?: No  CIWA:    COWS:     Musculoskeletal: Strength & Muscle Tone: within normal limits Gait & Station: normal Patient leans: N/A  Psychiatric Specialty Exam: Physical Exam  ROS no headache, no chest pain, no cough or shortness of breath, no fever or chills  Blood pressure 104/79, pulse 88, temperature (!) 97.5 F (36.4 C), temperature source Oral, resp. rate 16, height _0  (1.575 m), weight 44.5 kg, last menstrual period 01/19/2019, SpO2 99 %, unknown if currently breastfeeding.Body mass index is 17.92 kg/m.  General Appearance: Well Groomed  Eye Contact:  Good  Speech:  Normal Rate  Volume:  Normal  Mood:  Improved mood and currently presents euthymic  Affect:  Appropriate and  Full Range  Thought Process:  Linear and Descriptions of Associations: Intact  Orientation:  Other:  fully alert and attentive  Thought Content:  no hallucinations, no delusions   Suicidal Thoughts:  No denies suicidal or self injurious ideations, denies any homicidal or violent ideations, contracts for safety on unit  Homicidal Thoughts:  No  Memory:  recent and remote grossly intact   Judgement:  Other:  Improved  Insight:  Improving  Psychomotor Activity:  Normal-no agitation or restlessness  Concentration:  Concentration: Good and Attention Span: Good  Recall:  Good  Fund of Knowledge:  Good  Language:  Good  Akathisia:  Negative  Handed:  Right  AIMS (if indicated):     Assets:  Communication Skills Desire for Improvement Resilience  ADL's:  Intact  Cognition:  WNL  Sleep:  Number of Hours: 6   Assessment:  28 year old married female, has two children, homemaker , enrolled in college Presented for worsening depression, anxiety, panic, PTSD symptoms. Patient currently reports feeling better than she did prior to admission.  Today presents with improved mood and full range/bright affect.  Denies any suicidal ideations and presents future oriented.  He is currently tolerating medications well, denies side effects.  Treatment Plan Summary: Daily contact with patient to assess and evaluate symptoms and progress in treatment, Medication management, Plan inpatient treatment  and medications as below  Treatment plan  reviewed as below today 7/8 Encourage group and milieu participation to work on coping skills and symptom reduction Remeron 15 mgrs QHS for depression, insomnia, anxiety Minipress 1 mgr QHS for nightmares Trazodone 50 mgrs QHS PRN for insomnia Vistaril 25 mgrs Q 8 hours PRN for anxiety Treatment team working on disposition planning options  Jenne Campus, MD 01/22/2019, 4:13 PM   Patient ID: [name], adult   DOB: 09/30/1990, 28 y.o.   MRN: [id number]

## 2019-01-22 NOTE — Progress Notes (Signed)
Patient ID: Gwendolyn Lawrence, adult   DOB: May 13, 1991, 28 y.o.   MRN: [id number] D: Patient observed watching TV and interacting well with peers on approach. Pt mood and affect is animated but pleasant. Pt reports she is tolerating medications and is looking forward to discharge tomorrow. Pt attended evening wrap up group and engaged in discussions. Denies  SI/HI/AVH and pain.No behavioral issues noted.  A: Support and encouragement offered as needed to express needs. Medications administered as prescribed.  R: Patient is safe and cooperative on unit. Will continue to monitor  for safety and stability.

## 2019-01-23 DIAGNOSIS — F4310 Post-traumatic stress disorder, unspecified: Secondary | ICD-10-CM

## 2019-01-23 LAB — HIV-1 RNA, QUALITATIVE, TMA: HIV-1 RNA, Qualitative, TMA: NEGATIVE

## 2019-01-23 MED ORDER — NICOTINE 21 MG/24HR TD PT24: 21.0000 mg | MEDICATED_PATCH | Freq: Every day | TRANSDERMAL | 0 refills | Status: AC

## 2019-01-23 MED ORDER — ADULT MULTIVITAMIN W/MINERALS CH: 1.0000 | ORAL_TABLET | Freq: Every day | ORAL | Status: AC

## 2019-01-23 MED ORDER — MIRTAZAPINE 15 MG PO TBDP: 15.0000 mg | ORAL_TABLET | Freq: Every day | ORAL | 0 refills | Status: AC

## 2019-01-23 MED ORDER — ENSURE ENLIVE PO LIQD: 237.0000 mL | Freq: Three times a day (TID) | ORAL | 0 refills | Status: AC

## 2019-01-23 MED ORDER — PRAZOSIN HCL 1 MG PO CAPS: 1.0000 mg | ORAL_CAPSULE | Freq: Every day | ORAL | 0 refills | Status: AC
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
## 2019-01-23 NOTE — Plan of Care (Signed)
Discharge note  Patient verbalizes readiness for discharge. Follow up plan explained, AVS, Transition record and SRA given. Prescriptions and teaching provided. Belongings returned and signed for. Suicide safety plan completed and signed. Patient verbalizes understanding. Patient denies SI/HI and assures this writer she will seek assistance should that change. Patient discharged to lobby where husband was waiting.  Problem: Education: Goal: Knowledge of [facility] Education information/materials will improve Outcome: Adequate for Discharge Goal: Emotional status will improve Outcome: Adequate for Discharge Goal: Mental status will improve Outcome: Adequate for Discharge Goal: Verbalization of understanding the information provided will improve Outcome: Adequate for Discharge   Problem: Activity: Goal: Interest or engagement in activities will improve Outcome: Adequate for Discharge Goal: Sleeping patterns will improve Outcome: Adequate for Discharge   Problem: Coping: Goal: Ability to verbalize frustrations and anger appropriately will improve Outcome: Adequate for Discharge Goal: Ability to demonstrate self-control will improve Outcome: Adequate for Discharge   Problem: Health Behavior/Discharge Planning: Goal: Identification of resources available to assist in meeting health care needs will improve Outcome: Adequate for Discharge Goal: Compliance with treatment plan for underlying cause of condition will improve Outcome: Adequate for Discharge   Problem: Physical Regulation: Goal: Ability to maintain clinical measurements within normal limits will improve Outcome: Adequate for Discharge   Problem: Safety: Goal: Periods of time without injury will increase Outcome: Adequate for Discharge   Problem: Education: Goal: Utilization of techniques to improve thought processes will improve Outcome: Adequate for Discharge Goal: Knowledge of the prescribed therapeutic  regimen will improve Outcome: Adequate for Discharge   Problem: Activity: Goal: Interest or engagement in leisure activities will improve Outcome: Adequate for Discharge Goal: Imbalance in normal sleep/wake cycle will improve Outcome: Adequate for Discharge   Problem: Coping: Goal: Coping ability will improve Outcome: Adequate for Discharge Goal: Will verbalize feelings Outcome: Adequate for Discharge   Problem: Health Behavior/Discharge Planning: Goal: Ability to make decisions will improve Outcome: Adequate for Discharge Goal: Compliance with therapeutic regimen will improve Outcome: Adequate for Discharge   Problem: Role Relationship: Goal: Will demonstrate positive changes in social behaviors and relationships Outcome: Adequate for Discharge   Problem: Safety: Goal: Ability to disclose and discuss suicidal ideas will improve Outcome: Adequate for Discharge Goal: Ability to identify and utilize support systems that promote safety will improve Outcome: Adequate for Discharge   Problem: Self-Concept: Goal: Will verbalize positive feelings about self Outcome: Adequate for Discharge Goal: Level of anxiety will decrease Outcome: Adequate for Discharge   Problem: Education: Goal: Ability to state activities that reduce stress will improve Outcome: Adequate for Discharge   Problem: Coping: Goal: Ability to identify and develop effective coping behavior will improve Outcome: Adequate for Discharge   Problem: Self-Concept: Goal: Ability to identify factors that promote anxiety will improve Outcome: Adequate for Discharge Goal: Level of anxiety will decrease Outcome: Adequate for Discharge Goal: Ability to modify response to factors that promote anxiety will improve Outcome: Adequate for Discharge

## 2019-01-23 NOTE — BHH Suicide Risk Assessment (Signed)
[place] INPATIENT:  Family/Significant Other Suicide Prevention Education  Suicide Prevention Education:  Education Completed; Chelle Cayton, husband,( [phone]) has been identified by the patient as the family member/significant other with whom the patient will be residing, and identified as the person(s) who will aid the patient in the event of a mental health crisis (suicidal ideations/suicide attempt).  With written consent from the patient, the family member/significant other has been provided the following suicide prevention education, prior to the and/or following the discharge of the patient.  The suicide prevention education provided includes the following:  Suicide risk factors  Suicide prevention and interventions  National Suicide Hotline telephone number  [facility] assessment telephone number  [facility] Emergency Assistance [place] and/or Residential Mobile Crisis Unit telephone number  Request made of family/significant other to:  Remove weapons (e.g., guns, rifles, knives), all items previously/currently identified as safety concern.    Remove drugs/medications (over-the-counter, prescriptions, illicit drugs), all items previously/currently identified as a safety concern.  The family member/significant other verbalizes understanding of the suicide prevention education information provided.  The family member/significant other agrees to remove the items of safety concern listed above.  Rogue Jury reports he does not have any questions or concerns regarding the patient's discharge. He expressed understanding and gratitude for the patient's discharge plan.   CSW will continue to follow for a safe discharge.   [place] 01/23/2019, 10:00 AM

## 2019-01-23 NOTE — Progress Notes (Signed)
  [facility] Adult Case Management Discharge Plan :  Will you be returning to the same living situation after discharge:  Yes,  patient reports she is returning home with her husband At discharge, do you have transportation home?: Yes,  patient's husband is her picking up at discharge Do you have the ability to pay for your medications: No.  Release of information consent forms completed and in the chart;  Patient's signature needed at discharge.  Patient to Follow up at: Follow-up Information    [facility] Follow up on 01/27/2019.   Specialty: Professional Counselor Why: Please follow up during walk-in hours for mental health services on Monday, 7/13 at 8:30a.  Be sure to bring your photo ID, insurance card or proof of residency, SSN, current medications and discharge paperwork from this hospitalization.  Contact information: [facility] [place] [address] [phone]           Next level of care provider has access to [place] and Suicide Prevention discussed: Yes,  with the patient's husband     Has patient been referred to the Quitline?: N/A patient is not a smoker  Patient has been referred for addiction treatment: N/A  Marylee Floras, [place] 01/23/2019, 12:46 PM
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
## 2019-03-12 ENCOUNTER — Encounter (HOSPITAL_COMMUNITY): Payer: Self-pay | Admitting: *Deleted

## 2020-12-21 IMAGING — DX PORTABLE CHEST - 1 VIEW
1 series · 1 of 1 positions shown · non-contrast
Comparison: None.

CLINICAL DATA: Cough for 2 days.

EXAM:
PORTABLE CHEST 1 VIEW

[chest ap]
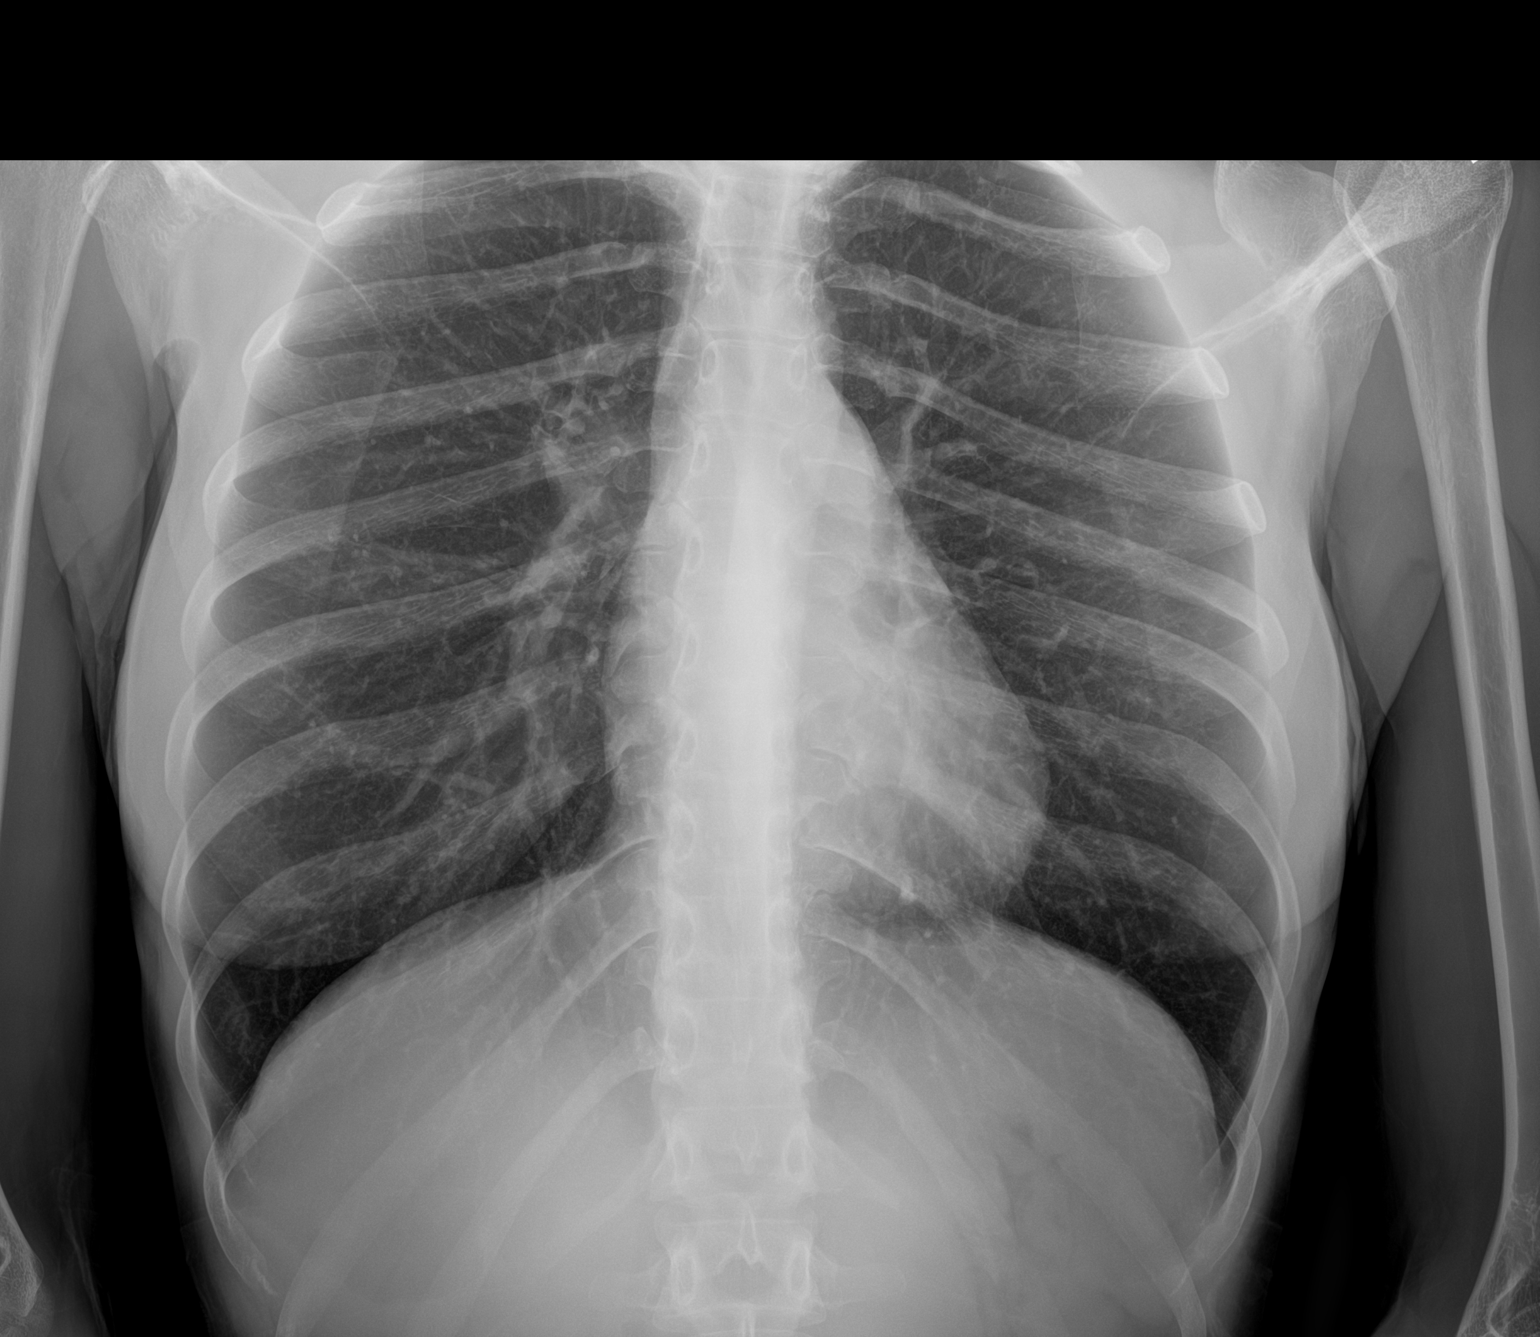

[1 of 1 positions shown; findings below may reference images not displayed]

FINDINGS: Mild hyperinflation.The cardiomediastinal contours are normal. The
lungs are clear. Pulmonary vasculature is normal. No consolidation,
pleural effusion, or pneumothorax. No acute osseous abnormalities
are seen.
IMPRESSION: Mild hyperinflation which may be voluntary or secondary to asthma or
bronchitis. No pneumonia
